# Patient Record
Sex: Male | Born: 1970 | Race: White | Hispanic: No | Marital: Married | Smoking: Former smoker
Health system: Southern US, Community
[De-identification: ages and names within clinical notes are randomized; demographics above are authoritative.]

## PROBLEM LIST (undated history)

## (undated) DIAGNOSIS — R011 Cardiac murmur, unspecified: Secondary | ICD-10-CM

## (undated) DIAGNOSIS — J301 Allergic rhinitis due to pollen: Secondary | ICD-10-CM

## (undated) DIAGNOSIS — J45909 Unspecified asthma, uncomplicated: Secondary | ICD-10-CM

## (undated) DIAGNOSIS — G609 Hereditary and idiopathic neuropathy, unspecified: Secondary | ICD-10-CM

## (undated) DIAGNOSIS — F419 Anxiety disorder, unspecified: Secondary | ICD-10-CM

## (undated) DIAGNOSIS — M19039 Primary osteoarthritis, unspecified wrist: Secondary | ICD-10-CM

## (undated) DIAGNOSIS — J683 Other acute and subacute respiratory conditions due to chemicals, gases, fumes and vapors: Secondary | ICD-10-CM

## (undated) HISTORY — DX: Unspecified asthma, uncomplicated: J45.909

## (undated) HISTORY — DX: Other acute and subacute respiratory conditions due to chemicals, gases, fumes and vapors: J68.3

## (undated) HISTORY — PX: COLONOSCOPY: SHX174

## (undated) HISTORY — DX: Primary osteoarthritis, unspecified wrist: M19.039

## (undated) HISTORY — DX: Anxiety disorder, unspecified: F41.9

## (undated) HISTORY — DX: Cardiac murmur, unspecified: R01.1

## (undated) HISTORY — DX: Allergic rhinitis due to pollen: J30.1

## (undated) HISTORY — PX: KNEE ARTHROSCOPY WITH ANTERIOR CRUCIATE LIGAMENT (ACL) REPAIR: SHX5644

---
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
## 2010-04-12 ENCOUNTER — Ambulatory Visit: Payer: Self-pay | Admitting: Internal Medicine

## 2010-08-25 ENCOUNTER — Ambulatory Visit: Payer: Self-pay | Admitting: Internal Medicine

## 2011-03-07 ENCOUNTER — Ambulatory Visit: Payer: Self-pay | Admitting: Family Medicine

## 2012-05-30 ENCOUNTER — Ambulatory Visit: Payer: Self-pay | Admitting: Family Medicine

## 2012-08-07 ENCOUNTER — Encounter: Payer: Self-pay | Admitting: Internal Medicine

## 2012-08-07 ENCOUNTER — Ambulatory Visit (INDEPENDENT_AMBULATORY_CARE_PROVIDER_SITE_OTHER): Payer: BC Managed Care – PPO | Admitting: Internal Medicine

## 2012-08-07 VITALS — BP 108/60 | HR 58 | Temp 97.9°F | Ht 71.0 in | Wt 218.0 lb

## 2012-08-07 DIAGNOSIS — M19039 Primary osteoarthritis, unspecified wrist: Secondary | ICD-10-CM | POA: Insufficient documentation

## 2012-08-07 DIAGNOSIS — R011 Cardiac murmur, unspecified: Secondary | ICD-10-CM

## 2012-08-07 DIAGNOSIS — Z23 Encounter for immunization: Secondary | ICD-10-CM

## 2012-08-07 DIAGNOSIS — Z0000 Encounter for general adult medical examination without abnormal findings: Secondary | ICD-10-CM

## 2012-08-07 DIAGNOSIS — J683 Other acute and subacute respiratory conditions due to chemicals, gases, fumes and vapors: Secondary | ICD-10-CM

## 2012-08-07 DIAGNOSIS — I340 Nonrheumatic mitral (valve) insufficiency: Secondary | ICD-10-CM | POA: Insufficient documentation

## 2012-08-07 DIAGNOSIS — J45909 Unspecified asthma, uncomplicated: Secondary | ICD-10-CM

## 2012-08-07 DIAGNOSIS — J301 Allergic rhinitis due to pollen: Secondary | ICD-10-CM

## 2012-08-07 MED ORDER — FLUTICASONE-SALMETEROL 250-50 MCG/DOSE IN AEPB: 1.0000 | INHALATION_SPRAY | Freq: Two times a day (BID) | RESPIRATORY_TRACT | Status: DC

## 2012-08-07 NOTE — Assessment & Plan Note (Signed)
??  MR Murmur not very apparent Will wait to get reports from Dr Gwen Pounds
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
## 2012-08-07 NOTE — Assessment & Plan Note (Signed)
Healthy Has been working on fitness Tdap updated

## 2012-08-07 NOTE — Assessment & Plan Note (Signed)
Has lots of mucus Discussed being more regular with loratadine

## 2012-08-07 NOTE — Assessment & Plan Note (Signed)
Mild intermittent symptoms Normal spirometry Does not use rescue inhaler No changes needed

## 2012-11-07 ENCOUNTER — Encounter: Payer: Self-pay | Admitting: Family Medicine

## 2012-11-07 ENCOUNTER — Ambulatory Visit (INDEPENDENT_AMBULATORY_CARE_PROVIDER_SITE_OTHER): Payer: BC Managed Care – PPO | Admitting: Family Medicine

## 2012-11-07 ENCOUNTER — Telehealth: Payer: Self-pay | Admitting: Internal Medicine

## 2012-11-07 VITALS — BP 110/72 | HR 63 | Temp 98.0°F | Wt 221.0 lb

## 2012-11-07 DIAGNOSIS — J329 Chronic sinusitis, unspecified: Secondary | ICD-10-CM | POA: Insufficient documentation

## 2012-11-07 MED ORDER — HYDROCODONE-HOMATROPINE 5-1.5 MG/5ML PO SYRP: 5.0000 mL | ORAL_SOLUTION | Freq: Every evening | ORAL | Status: DC | PRN

## 2012-11-07 MED ORDER — AZITHROMYCIN 250 MG PO TABS: ORAL_TABLET | ORAL | Status: DC

## 2012-11-07 NOTE — Telephone Encounter (Signed)
[facility] Triage Call Report Triage Record Num: [id number] Operator: Griselda Miner Patient Name: Paul Holland Call Date & Time: 11/06/2012 5:47:56PM Patient Phone: [phone] PCP: Tillman Abide Patient Gender: Male PCP Fax : [phone] Patient DOB: [birth year] Practice Name: [facility] Reason for Call: Caller: [name]; PCP: Tillman Abide (Family Practice); CB#: [phone]; Call regarding Cough/Congestion; Onset of symptoms with sore throat 11/03/12; Cough with drainage; Nasal drainage is clear; Cough non -productive; Is wheezing as noted by wife-coughing is fairly constant; is getting hot and cold. Appetite has not changed. Triaged using Cough with a disposition to be seen within 24 hours due to recurrent episodes of uncontrolled coughng interfering with ability to carry out usual activities or with normal sleep pattern. Care advice given. Appointment made for 11/07/12 at 10:15 with Dr. Sharen Hones. Caller demonstrated understanding. Protocol(s) Used: Cough - Adult Recommended Outcome per Protocol: See Provider within 24 hours Reason for Outcome: Recurrent episodes of uncontrolled coughing interfering with ability to carry out usual activities or with normal sleep patterns Care Advice: Call EMS 911 if sudden onset or sudden worsening of breathing problems, struggling to breathe, high pitched noise when breathing in (stridor), unable to speak, grasping at throat, or panic/anxiety because of breathing problems. ~ Increase fluids to 8-12 eight oz (1.6 to 2.4 liters) glasses per day, half of them to be water. Soups, popsicles, fruit juices, non-caffeinated sodas (unless restricting sodium intake), jello, broths, decaf teas, etc. are all okay. Warm fluids can be soothing. ~ Try to identify possible situations or irritants that triggered your symptoms (including medications) and be sure to tell your provider. ~ ~ Call provider immediately to report symptoms, if  you have not been previously diagnosed for this problem. 11/06/2012 6:09:07PM Page 1 of 1 [id number]

## 2012-11-07 NOTE — Progress Notes (Signed)
  Subjective:    Patient ID: Paul Holland, male    DOB: March 04, 1971, 41 y.o.   MRN: [id number]  HPI CC: cough  5d h/o ST, progressively worsening.  + sinus congestion, PNdrainage, fuzzy head.  Cough becoming more aggressive with phlegm production.  + chest discomfort with cough last night.  Tried dayquil and alka selzter day and night.  No fevers/chills, HA, ear or tooth pain, abd pain, n/v.  No sob, wheezing.  Asthma well controlled with advair.  Does not use albuterol. No sick contacts at home.  No smokers at home.  Past Medical History  Diagnosis Date  . Chemical-induced asthma     resin exposure at work  . Allergic rhinitis due to pollen   . Osteoarthritis of wrist   . Heart murmur     probably mild MR. Sees Dr Gwen Pounds     Review of Systems Per HPI    Objective:   Physical Exam  Nursing note and vitals reviewed. Constitutional: He appears well-developed and well-nourished. No distress.  HENT:  Head: Normocephalic and atraumatic.  Right Ear: Hearing, tympanic membrane, external ear and ear canal normal.  Left Ear: Hearing, tympanic membrane, external ear and ear canal normal.  Nose: Mucosal edema present. No rhinorrhea. Right sinus exhibits no maxillary sinus tenderness and no frontal sinus tenderness. Left sinus exhibits no maxillary sinus tenderness and no frontal sinus tenderness.  Mouth/Throat: Uvula is midline and mucous membranes are normal. Posterior oropharyngeal erythema present. No oropharyngeal exudate, posterior oropharyngeal edema or tonsillar abscesses.       TMs congested  Eyes: Conjunctivae normal and EOM are normal. Pupils are equal, round, and reactive to light. No scleral icterus.  Neck: Normal range of motion. Neck supple.  Cardiovascular: Normal rate, regular rhythm, normal heart sounds and intact distal pulses.   No murmur heard. Pulmonary/Chest: Effort normal and breath sounds normal. No respiratory distress. He has no wheezes. He has no rales.       clear  Lymphadenopathy:    He has no cervical adenopathy.  Skin: Skin is warm and dry. No rash noted.       Assessment & Plan:

## 2012-11-07 NOTE — Telephone Encounter (Signed)
Will await that evaluation in office

## 2012-11-07 NOTE — Assessment & Plan Note (Signed)
Anticipate viral given short duration of 5 days.  discussed this. Provided cough syrup and WASP for zpack to fill if sxs prolonged >10 days or worsening cough/fevers. Pt agrees with plan.

## 2012-11-07 NOTE — Patient Instructions (Signed)
Sounds like you have an upper respiratory infection, likely viral. Antibiotics are not needed for this.  Viral infections usually take 7-10 days to resolve.  The cough can last several weeks to go away. Continue medicines at home. Push fluids and plenty of rest. If cough worsening may fill hycodan cough syrup for night time. If fever >101, or worsening productive cough ,or symptoms past 10 days, fill antibiotic provided today. Call clinic with questions.  Good to see you today.

## 2013-05-14 ENCOUNTER — Ambulatory Visit: Payer: Self-pay | Admitting: Orthopedic Surgery

## 2013-05-19 ENCOUNTER — Ambulatory Visit: Payer: Self-pay | Admitting: Orthopedic Surgery

## 2013-05-20 ENCOUNTER — Ambulatory Visit: Payer: Self-pay | Admitting: Orthopedic Surgery

## 2013-07-24 ENCOUNTER — Ambulatory Visit: Payer: Self-pay | Admitting: Orthopedic Surgery

## 2013-08-07 ENCOUNTER — Encounter: Payer: BC Managed Care – PPO | Admitting: Internal Medicine
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
## 2013-09-02 ENCOUNTER — Other Ambulatory Visit: Payer: Self-pay | Admitting: Internal Medicine

## 2013-09-04 NOTE — Telephone Encounter (Signed)
Ok to fill 

## 2013-10-13 ENCOUNTER — Encounter: Payer: Self-pay | Admitting: Internal Medicine

## 2013-10-13 ENCOUNTER — Ambulatory Visit (INDEPENDENT_AMBULATORY_CARE_PROVIDER_SITE_OTHER): Payer: BC Managed Care – PPO | Admitting: Internal Medicine

## 2013-10-13 VITALS — BP 128/88 | HR 80 | Temp 99.2°F | Wt 237.0 lb

## 2013-10-13 DIAGNOSIS — J209 Acute bronchitis, unspecified: Secondary | ICD-10-CM | POA: Insufficient documentation

## 2013-10-13 MED ORDER — ALBUTEROL SULFATE HFA 108 (90 BASE) MCG/ACT IN AERS: 2.0000 | INHALATION_SPRAY | Freq: Four times a day (QID) | RESPIRATORY_TRACT | Status: DC | PRN

## 2013-10-13 MED ORDER — AMOXICILLIN 500 MG PO TABS: 1000.0000 mg | ORAL_TABLET | Freq: Two times a day (BID) | ORAL | Status: DC

## 2013-10-13 MED ORDER — HYDROCODONE-HOMATROPINE 5-1.5 MG/5ML PO SYRP: 5.0000 mL | ORAL_SOLUTION | Freq: Every evening | ORAL | Status: DC | PRN

## 2013-10-13 NOTE — Assessment & Plan Note (Signed)
Probably still viral With known lung damage--- may have some bronchospasm Will give cough syrup and albuterol   If worsens, can fill Rx for amoxil

## 2013-10-13 NOTE — Patient Instructions (Signed)
Please start the antibiotic if you get worse over by the end of the week (instead of improving)
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
## 2014-07-22 ENCOUNTER — Encounter: Payer: Self-pay | Admitting: Internal Medicine

## 2014-07-22 ENCOUNTER — Ambulatory Visit (INDEPENDENT_AMBULATORY_CARE_PROVIDER_SITE_OTHER): Payer: BC Managed Care – PPO | Admitting: Internal Medicine

## 2014-07-22 VITALS — BP 100/70 | HR 80 | Temp 98.2°F | Ht 71.5 in | Wt 240.0 lb

## 2014-07-22 DIAGNOSIS — I340 Nonrheumatic mitral (valve) insufficiency: Secondary | ICD-10-CM

## 2014-07-22 DIAGNOSIS — R5383 Other fatigue: Secondary | ICD-10-CM | POA: Insufficient documentation

## 2014-07-22 DIAGNOSIS — Z0000 Encounter for general adult medical examination without abnormal findings: Secondary | ICD-10-CM

## 2014-07-22 DIAGNOSIS — J683 Other acute and subacute respiratory conditions due to chemicals, gases, fumes and vapors: Secondary | ICD-10-CM

## 2014-07-22 DIAGNOSIS — R5381 Other malaise: Secondary | ICD-10-CM

## 2014-07-22 DIAGNOSIS — I059 Rheumatic mitral valve disease, unspecified: Secondary | ICD-10-CM

## 2014-07-22 LAB — COMPREHENSIVE METABOLIC PANEL
ALBUMIN: 4.3 g/dL (ref 3.5–5.2)
ALT: 50 U/L (ref 0–53)
AST: 34 U/L (ref 0–37)
Alkaline Phosphatase: 66 U/L (ref 39–117)
BUN: 13 mg/dL (ref 6–23)
CALCIUM: 9.4 mg/dL (ref 8.4–10.5)
CO2: 30 mEq/L (ref 19–32)
Chloride: 100 mEq/L (ref 96–112)
Creatinine, Ser: 1.1 mg/dL (ref 0.4–1.5)
GFR: 80.75 mL/min (ref >60.00)
Glucose, Bld: 97 mg/dL (ref 70–99)
POTASSIUM: 4.2 meq/L (ref 3.5–5.1)
Sodium: 137 mEq/L (ref 135–145)
TOTAL PROTEIN: 8 g/dL (ref 6.0–8.3)
Total Bilirubin: 1 mg/dL (ref 0.2–1.2)

## 2014-07-22 LAB — CBC WITH DIFFERENTIAL/PLATELET
BASOS ABS: 0 10*3/uL (ref 0.0–0.1)
Basophils Relative: 0.7 % (ref 0.0–3.0)
EOS ABS: 0.2 10*3/uL (ref 0.0–0.7)
Eosinophils Relative: 2.2 % (ref 0.0–5.0)
HCT: 47.9 % (ref 39.0–52.0)
Hemoglobin: 16.5 g/dL (ref 13.0–17.0)
LYMPHS PCT: 34.8 % (ref 12.0–46.0)
Lymphs Abs: 2.5 10*3/uL (ref 0.7–4.0)
MCHC: 34.5 g/dL (ref 30.0–36.0)
MCV: 94.4 fl (ref 78.0–100.0)
Monocytes Absolute: 0.7 10*3/uL (ref 0.1–1.0)
Monocytes Relative: 9.2 % (ref 3.0–12.0)
Neutro Abs: 3.8 10*3/uL (ref 1.4–7.7)
Neutrophils Relative %: 53.1 % (ref 43.0–77.0)
PLATELETS: 269 10*3/uL (ref 150.0–400.0)
RBC: 5.08 Mil/uL (ref 4.22–5.81)
RDW: 13.4 % (ref 11.5–15.5)
WBC: 7.1 10*3/uL (ref 4.0–10.5)

## 2014-07-22 LAB — LIPID PANEL
CHOL/HDL RATIO: 5
CHOLESTEROL: 190 mg/dL (ref 0–200)
HDL: 41.8 mg/dL (ref >39.00)
LDL Cholesterol: 120 mg/dL — ABNORMAL HIGH (ref 0–99)
NonHDL: 148.2
TRIGLYCERIDES: 140 mg/dL (ref 0.0–149.0)
VLDL: 28 mg/dL (ref 0.0–40.0)

## 2014-07-22 LAB — T4, FREE: Free T4: 0.75 ng/dL (ref 0.60–1.60)

## 2014-07-22 NOTE — Patient Instructions (Signed)
DASH Eating Plan DASH stands for "Dietary Approaches to Stop Hypertension." The DASH eating plan is a healthy eating plan that has been shown to reduce high blood pressure (hypertension). Additional health benefits may include reducing the risk of type 2 diabetes mellitus, heart disease, and stroke. The DASH eating plan may also help with weight loss. WHAT DO I NEED TO KNOW ABOUT THE DASH EATING PLAN? For the DASH eating plan, you will follow these general guidelines:  Choose foods with a percent daily value for sodium of less than 5% (as listed on the food label).  Use salt-free seasonings or herbs instead of table salt or sea salt.  Check with your health care provider or pharmacist before using salt substitutes.  Eat lower-sodium products, often labeled as "lower sodium" or "no salt added."  Eat fresh foods.  Eat more vegetables, fruits, and low-fat dairy products.  Choose whole grains. Look for the word "whole" as the first word in the ingredient list.  Choose fish and skinless chicken or turkey more often than red meat. Limit fish, poultry, and meat to 6 oz (170 g) each day.  Limit sweets, desserts, sugars, and sugary drinks.  Choose heart-healthy fats.  Limit cheese to 1 oz (28 g) per day.  Eat more home-cooked food and less restaurant, buffet, and fast food.  Limit fried foods.  Cook foods using methods other than frying.  Limit canned vegetables. If you do use them, rinse them well to decrease the sodium.  When eating at a restaurant, ask that your food be prepared with less salt, or no salt if possible. WHAT FOODS CAN I EAT? Seek help from a dietitian for individual calorie needs. Grains Whole grain or whole wheat bread. Brown rice. Whole grain or whole wheat pasta. Quinoa, bulgur, and whole grain cereals. Low-sodium cereals. Corn or whole wheat flour tortillas. Whole grain cornbread. Whole grain crackers. Low-sodium crackers. Vegetables Fresh or frozen vegetables  (raw, steamed, roasted, or grilled). Low-sodium or reduced-sodium tomato and vegetable juices. Low-sodium or reduced-sodium tomato sauce and paste. Low-sodium or reduced-sodium canned vegetables.  Fruits All fresh, canned (in natural juice), or frozen fruits. Meat and Other Protein Products Ground beef (85% or leaner), grass-fed beef, or beef trimmed of fat. Skinless chicken or turkey. Ground chicken or turkey. Pork trimmed of fat. All fish and seafood. Eggs. Dried beans, peas, or lentils. Unsalted nuts and seeds. Unsalted canned beans. Dairy Low-fat dairy products, such as skim or 1% milk, 2% or reduced-fat cheeses, low-fat ricotta or cottage cheese, or plain low-fat yogurt. Low-sodium or reduced-sodium cheeses. Fats and Oils Tub margarines without trans fats. Light or reduced-fat mayonnaise and salad dressings (reduced sodium). Avocado. Safflower, olive, or canola oils. Natural peanut or almond butter. Other Unsalted popcorn and pretzels. The items listed above may not be a complete list of recommended foods or beverages. Contact your dietitian for more options. WHAT FOODS ARE NOT RECOMMENDED? Grains White bread. White pasta. White rice. Refined cornbread. Bagels and croissants. Crackers that contain trans fat. Vegetables Creamed or fried vegetables. Vegetables in a cheese sauce. Regular canned vegetables. Regular canned tomato sauce and paste. Regular tomato and vegetable juices. Fruits Dried fruits. Canned fruit in light or heavy syrup. Fruit juice. Meat and Other Protein Products Fatty cuts of meat. Ribs, chicken wings, bacon, sausage, bologna, salami, chitterlings, fatback, hot dogs, bratwurst, and packaged luncheon meats. Salted nuts and seeds. Canned beans with salt. Dairy Whole or 2% milk, cream, half-and-half, and cream cheese. Whole-fat or sweetened yogurt. Full-fat   cheeses or blue cheese. Nondairy creamers and whipped toppings. Processed cheese, cheese spreads, or cheese  curds. Condiments Onion and garlic salt, seasoned salt, table salt, and sea salt. Canned and packaged gravies. Worcestershire sauce. Tartar sauce. Barbecue sauce. Teriyaki sauce. Soy sauce, including reduced sodium. Steak sauce. Fish sauce. Oyster sauce. Cocktail sauce. Horseradish. Ketchup and mustard. Meat flavorings and tenderizers. Bouillon cubes. Hot sauce. Tabasco sauce. Marinades. Taco seasonings. Relishes. Fats and Oils Butter, stick margarine, lard, shortening, ghee, and bacon fat. Coconut, palm kernel, or palm oils. Regular salad dressings. Other Pickles and olives. Salted popcorn and pretzels. The items listed above may not be a complete list of foods and beverages to avoid. Contact your dietitian for more information. WHERE CAN I FIND MORE INFORMATION? National Heart, Lung, and [employer] Institute: www.nhlbi.nih.gov/health/health-topics/topics/dash/ Document Released: 11/09/2011 Document Revised: 04/06/2014 Document Reviewed: 09/24/2013 ExitCare Patient Information 2015 [employer]. This information is not intended to replace advice given to you by your health care provider. Make sure you discuss any questions you have with your health care provider. Plantar Fasciitis Plantar fasciitis is a common condition that causes foot pain. It is soreness (inflammation) of the band of tough fibrous tissue on the bottom of the foot that runs from the heel bone (calcaneus) to the ball of the foot. The cause of this soreness may be from excessive standing, poor fitting shoes, running on hard surfaces, being overweight, having an abnormal walk, or overuse (this is common in runners) of the painful foot or feet. It is also common in aerobic exercise dancers and ballet dancers. SYMPTOMS  Most people with plantar fasciitis complain of:  Severe pain in the morning on the bottom of their foot especially when taking the first steps out of bed. This pain recedes after a few minutes of walking.  Severe pain  is experienced also during walking following a long period of inactivity.  Pain is worse when walking barefoot or up stairs DIAGNOSIS   Your caregiver will diagnose this condition by examining and feeling your foot.  Special tests such as X-rays of your foot, are usually not needed. PREVENTION   Consult a sports medicine professional before beginning a new exercise program.  Walking programs offer a good workout. With walking there is a lower chance of overuse injuries common to runners. There is less impact and less jarring of the joints.  Begin all new exercise programs slowly. If problems or pain develop, decrease the amount of time or distance until you are at a comfortable level.  Wear good shoes and replace them regularly.  Stretch your foot and the heel cords at the back of the ankle (Achilles tendon) both before and after exercise.  Run or exercise on even surfaces that are not hard. For example, asphalt is better than pavement.  Do not run barefoot on hard surfaces.  If using a treadmill, vary the incline.  Do not continue to workout if you have foot or joint problems. Seek professional help if they do not improve. HOME CARE INSTRUCTIONS   Avoid activities that cause you pain until you recover.  Use ice or cold packs on the problem or painful areas after working out.  Only take over-the-counter or prescription medicines for pain, discomfort, or fever as directed by your caregiver.  Soft shoe inserts or athletic shoes with air or gel sole cushions may be helpful.  If problems continue or become more severe, consult a sports medicine caregiver or your own health care provider. Cortisone is a potent   anti-inflammatory medication that may be injected into the painful area. You can discuss this treatment with your caregiver. MAKE SURE YOU:   Understand these instructions.  Will watch your condition.  Will get help right away if you are not doing well or get  worse. Document Released: 08/15/2001 Document Revised: 02/12/2012 Document Reviewed: 10/14/2008 ExitCare Patient Information 2015 [employer]. This information is not intended to replace advice given to you by your health care provider. Make sure you discuss any questions you have with your health care provider.  

## 2014-07-22 NOTE — Assessment & Plan Note (Signed)
Vague  PE reassuring--no nodes, etc Will just check labs

## 2014-07-22 NOTE — Progress Notes (Signed)
Pre visit review using our clinic review tool, if applicable. No additional management support is needed unless otherwise documented below in the visit note. 

## 2014-07-22 NOTE — Progress Notes (Signed)
Subjective:    Patient ID: Paul Holland, male    DOB: 05-17-1971, 43 y.o.   MRN: [id number]  HPI Here for physical  Still struggles with his right leg Some swelling in foot at times Finished his rehab but not doing more work on [employer] some right heel pain after walking---after a rest. Discussed plantar fasciitis  Feels tired in the past few months ?due to not exercising as much Has gained weight Sleeps okay---works 3rd shift and sleep is variable.  Current Outpatient Prescriptions on File Prior to Visit  Medication Sig Dispense Refill  . ADVAIR DISKUS 250-50 MCG/DOSE AEPB INHALE ONE DOSE BY MOUTH TWICE DAILY  60 each  3   No current facility-administered medications on file prior to visit.    No Known Allergies  Past Medical History  Diagnosis Date  . Chemical-induced asthma     resin exposure at work  . Allergic rhinitis due to pollen   . Osteoarthritis of wrist   . Heart murmur     probably mild MR. Sees Dr Nehemiah Massed    Past Surgical History  Procedure Laterality Date  . Cartilage removed from ribs  1981  . Knee arthroscopy with anterior cruciate ligament (acl) repair  6/14 & 8/14    meniscus repair and tibial fracture repair (bone graft). ACL not repaired    Family History  Problem Relation Age of Onset  . Diabetes Brother   . Hyperlipidemia Brother   . Hypertension Brother   . Cancer Paternal Uncle     colon  . Cancer Paternal Grandfather   . Heart disease Neg Hx     History   Social History  . Marital Status: Married    Spouse Name: N/A    Number of Children: 3  . Years of Education: N/A   Occupational History  . Supervisor-- Engineering geologist   Social History Main Topics  . Smoking status: Never Smoker   . Smokeless tobacco: Never Used  . Alcohol Use: Yes  . Drug Use: No  . Sexual Activity: Not on file   Other Topics Concern  . Not on file   Social History Narrative   2nd marriage   3 children who live with  mother in [place]----regular visits to them         Review of Systems  Constitutional: Positive for fatigue. Negative for unexpected weight change.       Wears seat belt  HENT: Negative for dental problem, hearing loss and tinnitus.        Overdue for dentist  Eyes: Negative for visual disturbance.       No diplopia or unilateral vision loss  Respiratory: Positive for cough. Negative for chest tightness and shortness of breath.        Only occasional cough  Cardiovascular: Positive for palpitations and leg swelling. Negative for chest pain.       Rare flutter in chest--- usually at rest.   Gastrointestinal: Negative for nausea, vomiting, abdominal pain, constipation and blood in stool.       No heartburn  Endocrine: Negative for cold intolerance and heat intolerance.  Genitourinary: Positive for difficulty urinating. Negative for urgency and frequency.       Occasional mild dribbling No sexual problems  Musculoskeletal: Positive for arthralgias, joint swelling and neck pain. Negative for back pain.       Only right knee problems Left wrist arthritis --but mild  Skin: Negative for rash.  Allergic/Immunologic: Positive  for environmental allergies. Negative for immunocompromised state.       Loratadine works  Neurological: Positive for numbness and headaches. Negative for dizziness, syncope, weakness and light-headedness.       Rare headache Arms go numb at times---better after chiropractic adjustment. CTS diagnosed years ago--no Rx  Hematological: Negative for adenopathy. Does not bruise/bleed easily.  Psychiatric/Behavioral: Negative for sleep disturbance and dysphoric mood. The patient is not nervous/anxious.        Objective:   Physical Exam  Constitutional: He is oriented to person, place, and time. He appears well-developed and well-nourished. No distress.  HENT:  Head: Normocephalic and atraumatic.  Right Ear: External ear normal.  Left Ear: External ear normal.    Mouth/Throat: Oropharynx is clear and moist. No oropharyngeal exudate.  Eyes: Conjunctivae and EOM are normal. Pupils are equal, round, and reactive to light.  Neck: Normal range of motion. Neck supple. No thyromegaly present.  Cardiovascular: Normal rate, regular rhythm and intact distal pulses.  Exam reveals no gallop.   No sig murmur heard  Abdominal: Soft. He exhibits no distension. There is no tenderness. There is no rebound and no guarding.  Musculoskeletal: He exhibits no edema and no tenderness.  Lymphadenopathy:    He has no cervical adenopathy.  Neurological: He is alert and oriented to person, place, and time.  Skin: No rash noted. No erythema.  Multiple benign nevi  Psychiatric: He has a normal mood and affect. His behavior is normal.          Assessment & Plan:

## 2014-07-22 NOTE — Assessment & Plan Note (Signed)
Healthy but out of shape Discussed fitness and rehab ongoing for his knee

## 2014-07-22 NOTE — Assessment & Plan Note (Signed)
Quiet on the advair

## 2014-07-22 NOTE — Assessment & Plan Note (Signed)
Very slight No action needed

## 2014-09-28 ENCOUNTER — Other Ambulatory Visit: Payer: Self-pay | Admitting: Family Medicine

## 2014-12-09 ENCOUNTER — Ambulatory Visit (INDEPENDENT_AMBULATORY_CARE_PROVIDER_SITE_OTHER): Payer: BLUE CROSS/BLUE SHIELD | Admitting: Internal Medicine

## 2014-12-09 ENCOUNTER — Encounter: Payer: Self-pay | Admitting: Internal Medicine

## 2014-12-09 VITALS — BP 124/72 | HR 70 | Temp 98.2°F | Wt 247.8 lb

## 2014-12-09 DIAGNOSIS — S61210D Laceration without foreign body of right index finger without damage to nail, subsequent encounter: Secondary | ICD-10-CM

## 2014-12-09 DIAGNOSIS — S61210A Laceration without foreign body of right index finger without damage to nail, initial encounter: Secondary | ICD-10-CM | POA: Insufficient documentation

## 2014-12-09 DIAGNOSIS — J069 Acute upper respiratory infection, unspecified: Secondary | ICD-10-CM | POA: Insufficient documentation

## 2014-12-09 NOTE — Progress Notes (Signed)
   Subjective:    Patient ID: Paul Holland, male    DOB: May 04, 1971, 44 y.o.   MRN: [id number]  HPI Was in [place] visiting family Flood in basement Using knife to cut away cords---slipped and cut right 2nd finger  Occurred 10 days ago No antibiotic sutured  Keeping wrapped Not inflamed or painful Slight itching  Also with chest cold Some cough Thinks it is improving--less drainage No SOB  Current Outpatient Prescriptions on File Prior to Visit  Medication Sig Dispense Refill  . ADVAIR DISKUS 250-50 MCG/DOSE AEPB INHALE ONE DOSE BY MOUTH TWICE DAILY 60 each 0  . loratadine (CLARITIN) 10 MG tablet Take 10-20 mg by mouth daily.     No current facility-administered medications on file prior to visit.    No Known Allergies  Past Medical History  Diagnosis Date  . Chemical-induced asthma     resin exposure at work  . Allergic rhinitis due to pollen   . Osteoarthritis of wrist   . Heart murmur     probably mild MR. Sees Dr Nehemiah Massed    Past Surgical History  Procedure Laterality Date  . Cartilage removed from ribs  1981  . Knee arthroscopy with anterior cruciate ligament (acl) repair  6/14 & 8/14    meniscus repair and tibial fracture repair (bone graft). ACL not repaired    Family History  Problem Relation Age of Onset  . Diabetes Brother   . Hyperlipidemia Brother   . Hypertension Brother   . Cancer Paternal Uncle     colon  . Cancer Paternal Grandfather   . Heart disease Neg Hx     History   Social History  . Marital Status: Married    Spouse Name: N/A    Number of Children: 3  . Years of Education: N/A   Occupational History  . Supervisor-- Engineering geologist   Social History Main Topics  . Smoking status: Never Smoker   . Smokeless tobacco: Never Used  . Alcohol Use: Yes  . Drug Use: No  . Sexual Activity: Not on file   Other Topics Concern  . Not on file   Social History Narrative   2nd marriage   3 children who live with  mother in [place]----regular visits to them         Review of Systems  No fever Hasn't been sick     Objective:   Physical Exam  Constitutional: He appears well-developed and well-nourished. No distress.  HENT:  Mouth/Throat: Oropharynx is clear and moist. No oropharyngeal exudate.  Neck: Normal range of motion. Neck supple.  Pulmonary/Chest: Effort normal and breath sounds normal. No respiratory distress. He has no wheezes. He has no rales.  Lymphadenopathy:    He has no cervical adenopathy.  Skin:  Transverse laceration across extensor right 2nd MCP 4 stitches removed Still open wound--- steri strips applied with benzoin          Assessment & Plan:

## 2014-12-09 NOTE — Progress Notes (Signed)
Pre visit review using our clinic review tool, if applicable. No additional management support is needed unless otherwise documented below in the visit note. 

## 2014-12-09 NOTE — Assessment & Plan Note (Signed)
Clean but not completely healed Sutures removed steristrips applied

## 2014-12-09 NOTE — Assessment & Plan Note (Signed)
Reassured Does seem to be self limited process Supportive Rx only

## 2015-03-26 NOTE — Op Note (Signed)
PATIENT NAME:  Paul Holland, BRAHMBHATT MR#:  [id number] DATE OF BIRTH:  1970-12-16  DATE OF PROCEDURE:  05/20/2013  PREOPERATIVE DIAGNOSIS:  Right lateral tibial plateau fracture.   POSTOPERATIVE DIAGNOSIS:  Right lateral tibial plateau fracture with lateral meniscus tear.   PROCEDURE:  Open reduction internal fixation lateral tibial plateau, lateral meniscus repair.   ANESTHESIA:  Spinal.  SURGEON:  Laurene Footman, M.D.  DESCRIPTION OF PROCEDURE:  The patient was brought to the operating room and after adequate anesthesia was obtained the C-arm was brought in to make sure there was good visualization on both AP and lateral projections.  After prepping and draping in the usual sterile fashion with a tourniquet applied to the upper thigh, the tourniquet was raised to 300 mmHg after appropriate patient identification, timeout procedures were completed.  A curvilinear incision was made over the proximal tibia laterally and the IT band split, insertion elevated at the tubercle.  The fracture site was identified and a drill holes were made to allow for removal of a bone window to allow for bone grafting impaction of the displaced joint fragments.  C-arm was brought in for this and with a narrow bone tamp the fracture fragments were elevated.  After elevating them the arthroscope was introduced through an inferolateral portal with a probe introduced inferomedially.  Inspection revealed mild patellofemoral chondromalacia.  In the medial compartment there was significant degenerative change in the medial femoral condyle that appeared chronic with intact meniscus.  The ACL appeared intact although there was some hemorrhage in the synovium adjacent to it.  On probing it appeared intact.  Going laterally, the joint fractures were identified and the lateral meniscus was found to have detached off the lateral capsule and was interposed into the fracture site.  At this point, the arthroscope was withdrawn and a submeniscal  arthrotomy carried out.  The meniscus was then grasped with sutures for subsequent repair to the capsule through the plate.  Bone graft was then impacted up through the bone window with cancellus bone graft inserted.  A clamp was then used to reduce the fracture with a plate applied.  A 5 hole Biomet lateral tibial Alps plate, standard curvature, and a K wire were placed through this to hold it in position.  After getting appropriate plate position the sutures were placed through the plate and the distal screw holes were filled through stab incisions with three cortical screws placed.  Next, the proximal screws were filled first with a nonlocking screw through the more proximal holes to get compression of the fracture site.  The arthroscope was put back in the joint at this time.  There was a little joint displacement at this point.  Next, the remaining proximal screws were filled using standard technique, drilling, measuring and placing the locking screws and then the 2 kickstand screws were inserted.  AP and lateral imaging at this point showed good position of the plate on both projections.  The meniscal sutures were then tightened and the meniscus appeared stable through the scope.  The arthrotomy was then repaired using Ethibond suture, the IT band repaired using #1 Vicryl, 2-0 Vicryl subcutaneously and skin staples for all incisions.  Xeroform, 4 x 4's, Webril, and Ace wrap were applied along with a Polar Care.  Tourniquet was let down prior to closure to allow for hemostasis.    TOURNIQUET TIME:  97 minutes at 300 mmHg.   ESTIMATED BLOOD LOSS:  100 mL.   COMPLICATIONS:  None.   SPECIMEN:  None.   IMPLANT:  Biomet Alps plate 5 hole on the right lateral tibial plateau plate with multiple screws.     ____________________________ Laurene Footman, MD mjm:ea D: 05/20/2013 22:26:14 ET T: 05/20/2013 23:02:24 ET JOB#: [id number]  cc: Laurene Footman, MD, <Dictator> Laurene Footman MD ELECTRONICALLY  SIGNED 05/21/2013 8:25

## 2015-03-26 NOTE — Op Note (Signed)
PATIENT NAME:  Paul Holland, Paul Holland MR#:  [id number] DATE OF BIRTH:  Jun 09, 1971  DATE OF PROCEDURE:  07/24/2013  PREOPERATIVE DIAGNOSIS: Right knee lateral meniscus tear, arthrofibrosis.   POSTOPERATIVE DIAGNOSIS: Synovitis right knee with anterior cruciate ligament tear.   PROCEDURE: Arthroscopy and synovectomy, right knee.   SURGEON: Laurene Footman, M.D.   ANESTHESIA: General.   DESCRIPTION OF PROCEDURE: The patient was brought to the operating room. After adequate anesthesia was obtained, the right knee was examined and with gentle manipulation additional flexion was obtained with palpable audible popping of adhesions. The leg was then placed in the arthroscopic legholder with the tourniquet applied, and the leg was prepped and draped in the usual sterile fashion. Appropriate patient identification and timeout procedures completed. An inferolateral portal made followed by an inferomedial portal for instrumentation. 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 The medial compartment had mild chondromalacia, synovitis anteriorly. 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 The lateral compartment was examined. The meniscus had previously been detached off the capsule and on probing it did appear that it had healed. There was some irregularity. There was some loose fissured cartilage on the tibia, but no loose bodies were noted. The gutters were also checked for loose bodies. With it apparently being mainly synovitis being the problem, a shaver was used, with the tourniquet raised to 300 mmHg, to debride the synovitis and scar tissue within the gutters to open up the spaces to allow for motion as well as debriding some of this anterior synovitis in the medial and lateral  compartments as well as in front of the notch.  It appeared to be minimizing the patella mobility. After extensive debridement of the this synovium, in the suprapatellar pouch, gutters, medial and lateral compartments, ArthroCare wand was used to aid in hemostasis. The knee was thoroughly irrigated and pre and postprocedure pictures having been obtained. All instrumentation was withdrawn. Tourniquet was let down prior to wound closure. Wounds were closed with simple interrupted 4-0 nylon skin sutures. 20 mL of 0.5% Sensorcaine with epinephrine was infiltrated into the area of the portals to aid in postoperative analgesia. Sterile dressings of Xeroform, 4 x 4's, Webril and Ace wrap were applied. The patient was sent to the recovery room in stable condition.   ESTIMATED BLOOD LOSS: Minimal.   COMPLICATIONS: None.   SPECIMEN: None.   CONDITION: To recovery room stable. ____________________________ Laurene Footman, MD mjm:sb D: 07/24/2013 13:37:07 ET T: 07/24/2013 17:21:56 ET JOB#: [id number]  cc: Laurene Footman, MD, <Dictator> Laurene Footman MD ELECTRONICALLY SIGNED 07/24/2013 17:58

## 2015-03-30 ENCOUNTER — Ambulatory Visit
Admit: 2015-03-30 | Disposition: A | Discharge status: Home / Self Care | Payer: Self-pay | Attending: Orthopedic Surgery | Admitting: Orthopedic Surgery

## 2015-04-04 NOTE — Op Note (Signed)
PATIENT NAME:  [name], Paul Holland MR#:  [id number] DATE OF BIRTH:  1971-02-14  DATE OF PROCEDURE:  03/30/2015  PREOPERATIVE DIAGNOSIS: Painful hardware, right lateral proximal tibia.   POSTOPERATIVE DIAGNOSIS:   Painful hardware, right lateral proximal tibia.   PROCEDURE: Removal of deep hardware.   ANESTHESIA: General.   SURGEON: Hessie Knows, MD   DESCRIPTION OF PROCEDURE: The patient was brought to the operating room and after adequate anesthesia was obtained, the right leg was prepped and draped in the usual sterile fashion. After patient identification and timeout procedures were completed, tourniquet was raised and the prior incision was opened and the plate exposed proximally. The kickstand screws were removed followed by the proximal screws then the distal screws corticals with the aid of a mini C-arm. All the screws could be removed except one from the proximal row of the plate.  This screw appeared stripped and could not be removed even using the broken screw set.  With the plate being quite painful, this screw needed to be removed, so  decision was made to extend the initial incision down anteriorly and elevating the anterior tibialis off the tibia. This exposed the plate and allowed me to essentially rotate the plate counterclockwise.  This  loosened the screw enough that it could be grasped with the wrench from the broken  screw set and the screws removed without difficulty.  The wound  was thoroughly irrigated. Tourniquet was let down and there was moderate bleeding from the screw holes in the tibia.   The tourniquet was raised again for closure with #1 Vicryl to repair anterior tibialis and the proximal split of the IT band, 2-0 Vicryl subcutaneously, and skin staples. Thirty mL of 0.5% Sensorcaine plain was infiltrated around the incision to aid in postoperative analgesia.  The wound was dressed with Xeroform, 4 x 4's, ABD, Webril and ACE wrap.    ESTIMATED BLOOD LOSS: Minimal.    COMPLICATIONS: None.   SPECIMEN: None. Hardware had a picture taken and then it was discarded.   TOURNIQUET TIME:  Was 56 minutes at 300 mmHg.     ____________________________ Laurene Footman, MD mjm:tr D: 03/30/2015 23:06:25 ET T: 03/31/2015 10:00:21 ET JOB#: [id number]  cc: Laurene Footman, MD, <Dictator> Laurene Footman MD ELECTRONICALLY SIGNED 03/31/2015 19:41

## 2015-07-29 ENCOUNTER — Ambulatory Visit (INDEPENDENT_AMBULATORY_CARE_PROVIDER_SITE_OTHER): Payer: BLUE CROSS/BLUE SHIELD | Admitting: Internal Medicine

## 2015-07-29 ENCOUNTER — Encounter: Payer: Self-pay | Admitting: Internal Medicine

## 2015-07-29 VITALS — BP 120/70 | HR 70 | Temp 98.4°F | Wt 241.0 lb

## 2015-07-29 DIAGNOSIS — M7711 Lateral epicondylitis, right elbow: Secondary | ICD-10-CM | POA: Diagnosis not present

## 2015-07-29 DIAGNOSIS — M7712 Lateral epicondylitis, left elbow: Secondary | ICD-10-CM | POA: Diagnosis not present

## 2015-07-29 NOTE — Progress Notes (Signed)
Pre visit review using our clinic review tool, if applicable. No additional management support is needed unless otherwise documented below in the visit note. 

## 2015-07-29 NOTE — Assessment & Plan Note (Signed)
Very mild No signs of bony issues Discussed ice and NSAIDs (and rest)

## 2015-07-29 NOTE — Progress Notes (Signed)
   Subjective:    Patient ID: Paul Holland, male    DOB: 03/17/1971, 44 y.o.   MRN: [id number]  HPI Here due to left elbow pain  Shadow boxing about a month ago Started feeling pain so took some time off Points to lateral epicondyle Some pain in same area in right elbow also Better now--but hasn't gone away  Found lump in neck when at beach ~2 weeks ago No pain Hasn't bothered him/no drainage  Ibuprofen 200 bid has helped  Current Outpatient Prescriptions on File Prior to Visit  Medication Sig Dispense Refill  . ADVAIR DISKUS 250-50 MCG/DOSE AEPB INHALE ONE DOSE BY MOUTH TWICE DAILY 60 each 0  . loratadine (CLARITIN) 10 MG tablet Take 10-20 mg by mouth daily.     No current facility-administered medications on file prior to visit.    No Known Allergies  Past Medical History  Diagnosis Date  . Chemical-induced asthma     resin exposure at work  . Allergic rhinitis due to pollen   . Osteoarthritis of wrist   . Heart murmur     probably mild MR. Sees Dr Nehemiah Massed    Past Surgical History  Procedure Laterality Date  . Cartilage removed from ribs  1981  . Knee arthroscopy with anterior cruciate ligament (acl) repair  6/14 & 8/14    meniscus repair and tibial fracture repair (bone graft). ACL not repaired    Family History  Problem Relation Age of Onset  . Diabetes Brother   . Hyperlipidemia Brother   . Hypertension Brother   . Cancer Paternal Uncle     colon  . Cancer Paternal Grandfather   . Heart disease Neg Hx     Social History   Social History  . Marital Status: Married    Spouse Name: N/A  . Number of Children: 3  . Years of Education: N/A   Occupational History  . Supervisor-- Engineering geologist   Social History Main Topics  . Smoking status: Never Smoker   . Smokeless tobacco: Never Used  . Alcohol Use: Yes  . Drug Use: No  . Sexual Activity: Not on file   Other Topics Concern  . Not on file   Social History Narrative   2nd marriage   3 children who live with mother in [place]----regular visits to them         Review of Systems Mostly walks around or on computer at work Does awaken with elbow pain at times--will shoot to arm at times Gets adjustments at chiropractor every 2-3 months    Objective:   Physical Exam  Constitutional: He appears well-developed and well-nourished. No distress.  Neck:  ~0.5 cm non tender subq mass in right lateral neck---node vs cyst. reassured  Musculoskeletal:  No swelling in either elbow No tenderness  Neurological:  No arm weakness          Assessment & Plan:

## 2015-12-27 ENCOUNTER — Ambulatory Visit (INDEPENDENT_AMBULATORY_CARE_PROVIDER_SITE_OTHER): Payer: BLUE CROSS/BLUE SHIELD | Admitting: Internal Medicine

## 2015-12-27 ENCOUNTER — Encounter: Payer: Self-pay | Admitting: Internal Medicine

## 2015-12-27 VITALS — BP 118/70 | HR 88 | Temp 98.4°F | Wt 241.0 lb

## 2015-12-27 DIAGNOSIS — L03032 Cellulitis of left toe: Secondary | ICD-10-CM

## 2015-12-27 DIAGNOSIS — S76219A Strain of adductor muscle, fascia and tendon of unspecified thigh, initial encounter: Secondary | ICD-10-CM | POA: Insufficient documentation

## 2015-12-27 DIAGNOSIS — S39011A Strain of muscle, fascia and tendon of abdomen, initial encounter: Secondary | ICD-10-CM | POA: Diagnosis not present

## 2015-12-27 MED ORDER — CEPHALEXIN 500 MG PO CAPS: 500.0000 mg | ORAL_CAPSULE | Freq: Three times a day (TID) | ORAL | Status: DC

## 2015-12-27 NOTE — Assessment & Plan Note (Signed)
On left Mild May be from the core program he is doing--moving back and forth on slide board

## 2015-12-27 NOTE — Progress Notes (Signed)
Pre visit review using our clinic review tool, if applicable. No additional management support is needed unless otherwise documented below in the visit note. 

## 2015-12-27 NOTE — Progress Notes (Signed)
   Subjective:    Patient ID: Paul Holland, male    DOB: [birth year], 45 y.o.   MRN: [id number]  HPI Here due to left great toe pain and pain in groin  Left great toe sore and red for a couple of weeks Thinks he has ingrown toenail Soaking has helped---epsom salts But then flares up Now constantly red No prior issues with ingrown nails--he keeps them trimmed No fever  Left testicular pain in past--had ultrasound Now with pain in curve around scrotum and leg Feels intermittent pressure and pain Can be when sitting, walking or even lying down  Current Outpatient Prescriptions on File Prior to Visit  Medication Sig Dispense Refill  . ADVAIR DISKUS 250-50 MCG/DOSE AEPB INHALE ONE DOSE BY MOUTH TWICE DAILY 60 each 0  . loratadine (CLARITIN) 10 MG tablet Take 10-20 mg by mouth daily.     No current facility-administered medications on file prior to visit.    No Known Allergies  Past Medical History  Diagnosis Date  . Chemical-induced asthma ([place])     resin exposure at work  . Allergic rhinitis due to pollen   . Osteoarthritis of wrist   . Heart murmur     probably mild MR. Sees Dr Nehemiah Massed    Past Surgical History  Procedure Laterality Date  . Cartilage removed from ribs  1981  . Knee arthroscopy with anterior cruciate ligament (acl) repair  6/14 & 8/14    meniscus repair and tibial fracture repair (bone graft). ACL not repaired    Family History  Problem Relation Age of Onset  . Diabetes Brother   . Hyperlipidemia Brother   . Hypertension Brother   . Cancer Paternal Uncle     colon  . Cancer Paternal Grandfather   . Heart disease Neg Hx     Social History   Social History  . Marital Status: Married    Spouse Name: N/A  . Number of Children: 3  . Years of Education: N/A   Occupational History  . Supervisor-- Engineering geologist   Social History Main Topics  . Smoking status: Never Smoker   . Smokeless tobacco: Never Used  . Alcohol Use:  Yes  . Drug Use: No  . Sexual Activity: Not on file   Other Topics Concern  . Not on file   Social History Narrative   2nd marriage   3 children who live with mother in [place]----regular visits to them         Review of Systems No urinary symptoms-- no dysuria or hematuria No intercourse in a month-- wife in [place]  (university professor) Bowels are fine Appetite is good    Objective:   Physical Exam  Genitourinary:  Scrotum is quiet---no hernia, testicular enlargement or tenderness No inguinal hernias or apparent femoral hernia. Pain area is right in base of groin  Skin:  ?slight distal lateral ingrowing at left great toenail Mild redness and tenderness No discharge          Assessment & Plan:

## 2015-12-27 NOTE — Assessment & Plan Note (Signed)
Mild Discussed continued soaks and distal nail debridement (explained how to do this) 5 days cephalexin

## 2016-02-07 ENCOUNTER — Encounter: Payer: Self-pay | Admitting: Primary Care

## 2016-02-07 ENCOUNTER — Ambulatory Visit: Payer: BLUE CROSS/BLUE SHIELD | Admitting: Internal Medicine

## 2016-02-07 ENCOUNTER — Ambulatory Visit (INDEPENDENT_AMBULATORY_CARE_PROVIDER_SITE_OTHER): Payer: BLUE CROSS/BLUE SHIELD | Admitting: Primary Care

## 2016-02-07 VITALS — BP 136/84 | HR 96 | Temp 99.2°F | Ht 71.5 in | Wt 246.4 lb

## 2016-02-07 DIAGNOSIS — R059 Cough, unspecified: Secondary | ICD-10-CM

## 2016-02-07 DIAGNOSIS — R05 Cough: Secondary | ICD-10-CM | POA: Diagnosis not present

## 2016-02-07 MED ORDER — BENZONATATE 200 MG PO CAPS: 200.0000 mg | ORAL_CAPSULE | Freq: Three times a day (TID) | ORAL | Status: DC | PRN

## 2016-02-07 MED ORDER — HYDROCODONE-HOMATROPINE 5-1.5 MG/5ML PO SYRP: 5.0000 mL | ORAL_SOLUTION | Freq: Every evening | ORAL | Status: DC | PRN

## 2016-02-07 MED ORDER — FLUTICASONE-SALMETEROL 250-50 MCG/DOSE IN AEPB: 1.0000 | INHALATION_SPRAY | Freq: Two times a day (BID) | RESPIRATORY_TRACT | Status: DC

## 2016-02-07 NOTE — Progress Notes (Signed)
Pre visit review using our clinic review tool, if applicable. No additional management support is needed unless otherwise documented below in the visit note. 

## 2016-02-07 NOTE — Progress Notes (Signed)
Subjective:    Patient ID: Paul Holland, male    DOB: 1971-07-15, 45 y.o.   MRN: [id number]  HPI  Mr. Trombly is a 45 year old male who presents today with a chief complaint of cough. He also reports chest congestion, nasal congestion, and sore throat. His symptoms began Saturday this past weekend (2 days ago). He has a low grade fever today in the clinic. He's has co-workers who were diagnosed with both the flu and strep last week. He's taken Mucinex and cough drops with temporary improvement. Nothing in particular makes his symptoms worse. Denies home sick contacts.  Review of Systems  Constitutional: Positive for fever, chills and fatigue.  HENT: Positive for congestion and sore throat. Negative for ear pain and sinus pressure.   Respiratory: Positive for cough. Negative for shortness of breath.   Cardiovascular: Negative for chest pain.  Musculoskeletal: Negative for myalgias.       Past Medical History  Diagnosis Date  . 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 Allergic rhinitis due to pollen   . Osteoarthritis of wrist   . Heart murmur     probably mild MR. Sees Dr Nehemiah Massed    Social History   Social History  . Marital Status: Married    Spouse Name: N/A  . Number of Children: 3  . Years of Education: N/A   Occupational History  . Supervisor-- Engineering geologist   Social History Main Topics  . Smoking status: Never Smoker   . Smokeless tobacco: Never Used  . Alcohol Use: Yes  . Drug Use: No  . Sexual Activity: Not on file   Other Topics Concern  . Not on file   Social History Narrative   2nd marriage   3 children who live with mother in [place]----regular visits to them          Past Surgical History  Procedure Laterality Date  . Cartilage removed from ribs  1981  . Knee arthroscopy with anterior cruciate ligament (acl) repair  6/14 & 8/14    meniscus repair and tibial fracture repair (bone graft). ACL not repaired      Family History  Problem Relation Age of Onset  . Diabetes Brother   . Hyperlipidemia Brother   . Hypertension Brother   . Cancer Paternal Uncle     colon  . Cancer Paternal Grandfather   . Heart disease Neg Hx     No Known Allergies  Current Outpatient Prescriptions on File Prior to Visit  Medication Sig Dispense Refill  . loratadine (CLARITIN) 10 MG tablet Take 10-20 mg by mouth daily.     No current facility-administered medications on file prior to visit.    BP 136/84 mmHg  Pulse 96  Temp(Src) 99.2 F (37.3 C) (Oral)  Ht 5' 11.5" (1.816 m)  Wt 246 lb 6.4 oz (111.766 kg)  BMI 33.89 kg/m2  SpO2 97%    Objective:   Physical Exam  Constitutional: He appears well-nourished.  HENT:  Right Ear: Tympanic membrane and ear canal normal.  Left Ear: Tympanic membrane and ear canal normal.  Nose: Right sinus exhibits no maxillary sinus tenderness and no frontal sinus tenderness. Left sinus exhibits no maxillary sinus tenderness and no frontal sinus tenderness.  Mouth/Throat: Oropharynx is clear and moist.  Neck: Neck supple.  Cardiovascular: Normal rate and regular rhythm.   Pulmonary/Chest: Effort normal and breath sounds normal. He has no wheezes.  Lymphadenopathy:  He has no cervical adenopathy.  Skin: Skin is warm and dry.          Assessment & Plan:  Viral URI:  Cough, congestion, low grade fever, fatigue x 2 days. Co-workers with flu and strep.  Exam with clear lungs and unremarkable HENT exam. Doesn't appear sickly, but tired. Suspect viral URI and will treat with supportive measures. Mucinex, Flonase, tessalon pearls for day cough, Hycodan HS. Return precautions provided.

## 2016-02-07 NOTE — Patient Instructions (Signed)
Cough/Congestion: Continue taking Mucinex DM. This will help loosen up the mucous in your chest. Ensure you take this medication with a full glass of water.  Nasal Congestion: Try using Flonase (fluticasone) nasal spray. Instill 2 sprays in each nostril once daily.   You may take Benzonatate capsules for cough. Take 1 capsule by mouth three times daily as needed for daytime cough. You may also try Delsym, which is over the counter.  You may take the Hycodan cough suppressant at bedtime as needed for cough and rest. Caution this medication contains codeine and will make you feel drowsy.  Please notify me if you develop persistent fevers of 101, start coughing up green mucous, notice increased fatigue or weakness, or feel worse after 1 week of onset of symptoms.   Increase consumption of water intake and rest.  It was a pleasure meeting you!

## 2016-08-03 ENCOUNTER — Ambulatory Visit (INDEPENDENT_AMBULATORY_CARE_PROVIDER_SITE_OTHER): Payer: BLUE CROSS/BLUE SHIELD | Admitting: Internal Medicine

## 2016-08-03 ENCOUNTER — Encounter: Payer: Self-pay | Admitting: Internal Medicine

## 2016-08-03 VITALS — BP 102/80 | HR 77 | Temp 98.1°F | Ht 70.75 in | Wt 244.0 lb

## 2016-08-03 DIAGNOSIS — Z0000 Encounter for general adult medical examination without abnormal findings: Secondary | ICD-10-CM | POA: Diagnosis not present

## 2016-08-03 DIAGNOSIS — J45998 Other asthma: Secondary | ICD-10-CM

## 2016-08-03 DIAGNOSIS — Z23 Encounter for immunization: Secondary | ICD-10-CM | POA: Diagnosis not present

## 2016-08-03 DIAGNOSIS — J683 Other acute and subacute respiratory conditions due to chemicals, gases, fumes and vapors: Secondary | ICD-10-CM | POA: Diagnosis not present

## 2016-08-03 NOTE — Patient Instructions (Addendum)
Please set up with a dermatologist--I recommend Dr [facility]ia [facility] or [facility]--both in [place].  DASH Eating Plan DASH stands for "Dietary Approaches to Stop Hypertension." The DASH eating plan is a healthy eating plan that has been shown to reduce high blood pressure (hypertension). Additional health benefits may include reducing the risk of type 2 diabetes mellitus, heart disease, and stroke. The DASH eating plan may also help with weight loss. WHAT DO I NEED TO KNOW ABOUT THE DASH EATING PLAN? For the DASH eating plan, you will follow these general guidelines:  Choose foods with a percent daily value for sodium of less than 5% (as listed on the food label).  Use salt-free seasonings or herbs instead of table salt or sea salt.  Check with your health care provider or pharmacist before using salt substitutes.  Eat lower-sodium products, often labeled as "lower sodium" or "no salt added."  Eat fresh foods.  Eat more vegetables, fruits, and low-fat dairy products.  Choose whole grains. Look for the word "whole" as the first word in the ingredient list.  Choose fish and skinless chicken or [place] more often than red meat. Limit fish, poultry, and meat to 6 oz (170 g) each day.  Limit sweets, desserts, sugars, and sugary drinks.  Choose heart-healthy fats.  Limit cheese to 1 oz (28 g) per day.  Eat more home-cooked food and less restaurant, buffet, and fast food.  Limit fried foods.  Cook foods using methods other than frying.  Limit canned vegetables. If you do use them, rinse them well to decrease the sodium.  When eating at a restaurant, ask that your food be prepared with less salt, or no salt if possible. WHAT FOODS CAN I EAT? Seek help from a dietitian for individual calorie needs. Grains Whole grain or whole wheat bread. Brown rice. Whole grain or whole wheat pasta. Quinoa, bulgur, and whole grain cereals. Low-sodium cereals. Corn or whole wheat flour  tortillas. Whole grain cornbread. Whole grain crackers. Low-sodium crackers. Vegetables Fresh or frozen vegetables (raw, steamed, roasted, or grilled). Low-sodium or reduced-sodium tomato and vegetable juices. Low-sodium or reduced-sodium tomato sauce and paste. Low-sodium or reduced-sodium canned vegetables.  Fruits All fresh, canned (in natural juice), or frozen fruits. Meat and Other Protein Products Ground beef (85% or leaner), grass-fed beef, or beef trimmed of fat. Skinless chicken or [place]. Ground chicken or [place]. Pork trimmed of fat. All fish and seafood. Eggs. Dried beans, peas, or lentils. Unsalted nuts and seeds. Unsalted canned beans. Dairy Low-fat dairy products, such as skim or 1% milk, 2% or reduced-fat cheeses, low-fat ricotta or cottage cheese, or plain low-fat yogurt. Low-sodium or reduced-sodium cheeses. Fats and Oils Tub margarines without trans fats. Light or reduced-fat mayonnaise and salad dressings (reduced sodium). Avocado. Safflower, olive, or canola oils. Natural peanut or almond butter. Other Unsalted popcorn and pretzels. The items listed above may not be a complete list of recommended foods or beverages. Contact your dietitian for more options. WHAT FOODS ARE NOT RECOMMENDED? Grains White bread. White pasta. White rice. Refined cornbread. Bagels and croissants. Crackers that contain trans fat. Vegetables Creamed or fried vegetables. Vegetables in a cheese sauce. Regular canned vegetables. Regular canned tomato sauce and paste. Regular tomato and vegetable juices. Fruits Dried fruits. Canned fruit in light or heavy syrup. Fruit juice. Meat and Other Protein Products Fatty cuts of meat. Ribs, chicken wings, bacon, sausage, bologna, salami, chitterlings, fatback, hot dogs, bratwurst, and packaged luncheon meats. Salted nuts and seeds. Canned beans with salt.  Dairy Whole or 2% milk, cream, half-and-half, and cream cheese. Whole-fat or sweetened yogurt. Full-fat  cheeses or blue cheese. Nondairy creamers and whipped toppings. Processed cheese, cheese spreads, or cheese curds. Condiments Onion and garlic salt, seasoned salt, table salt, and sea salt. Canned and packaged gravies. Worcestershire sauce. Tartar sauce. Barbecue sauce. Teriyaki sauce. Soy sauce, including reduced sodium. Steak sauce. Fish sauce. Oyster sauce. Cocktail sauce. Horseradish. Ketchup and mustard. Meat flavorings and tenderizers. Bouillon cubes. Hot sauce. Tabasco sauce. Marinades. Taco seasonings. Relishes. Fats and Oils Butter, stick margarine, lard, shortening, ghee, and bacon fat. Coconut, palm kernel, or palm oils. Regular salad dressings. Other Pickles and olives. Salted popcorn and pretzels. The items listed above may not be a complete list of foods and beverages to avoid. Contact your dietitian for more information. WHERE CAN I FIND MORE INFORMATION? National Heart, Lung, and Blood Institute: travelstabloid.com   This information is not intended to replace advice given to you by your health care provider. Make sure you discuss any questions you have with your health care provider.   Document Released: 11/09/2011 Document Revised: 12/11/2014 Document Reviewed: 09/24/2013 Elsevier Interactive Patient Education [employer].

## 2016-08-03 NOTE — Addendum Note (Signed)
Addended by: Pilar Grammes on: 08/03/2016 04:01 PM   Modules accepted: Orders

## 2016-08-03 NOTE — Assessment & Plan Note (Signed)
Does okay with regular inhaler--he will discuss with pharmacist if anything cheaper

## 2016-08-03 NOTE — Progress Notes (Signed)
Pre visit review using our clinic review tool, if applicable. No additional management support is needed unless otherwise documented below in the visit note. 

## 2016-08-03 NOTE — Progress Notes (Signed)
Subjective:    Patient ID: Paul Holland, male    DOB: [birth year], 45 y.o.   MRN: [id number]  HPI Here for physical  He feels well Continues on the inhaler--wonders about alternative (due to cough) Will skip days ---other than allergy season Uses the loratadine year round  Tries to walk regularly 12K steps per day  Current Outpatient Prescriptions on File Prior to Visit  Medication Sig Dispense Refill  . Fluticasone-Salmeterol (ADVAIR DISKUS) 250-50 MCG/DOSE AEPB Inhale 1 puff into the lungs 2 (two) times daily. 60 each 1  . loratadine (CLARITIN) 10 MG tablet Take 10-20 mg by mouth daily.     No current facility-administered medications on file prior to visit.     No Known Allergies  Past Medical History:  Diagnosis Date  . Allergic rhinitis due to pollen   . 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 Heart murmur    probably mild MR. Sees Dr Nehemiah Massed  . Osteoarthritis of wrist     Past Surgical History:  Procedure Laterality Date  . Cartilage removed from ribs  1981  . KNEE ARTHROSCOPY WITH ANTERIOR CRUCIATE LIGAMENT (ACL) REPAIR  6/14 & 8/14   meniscus repair and tibial fracture repair (bone graft). ACL not repaired    Family History  Problem Relation Age of Onset  . Diabetes Brother   . Hyperlipidemia Brother   . Hypertension Brother   . Cancer Paternal Uncle     colon  . Cancer Paternal Grandfather   . Heart disease Neg Hx     Social History   Social History  . Marital status: Married    Spouse name: N/A  . Number of children: 3  . Years of education: N/A   Occupational History  . Supervisor-- Engineering geologist   Social History Main Topics  . Smoking status: Former Research scientist (life sciences)  . Smokeless tobacco: Never Used  . Alcohol use Yes  . Drug use: No  . Sexual activity: Not on file   Other Topics Concern  . Not on file   Social History Narrative   2nd marriage   3 children who live with mother in [place]----regular  visits to them         Review of Systems  Constitutional: Negative for fatigue and unexpected weight change.       Wears seat belt  HENT: Positive for tinnitus. Negative for dental problem and hearing loss.        Keeps up with dentist  Eyes: Negative for visual disturbance.       No diplopia or unilateral vision loss  Respiratory: Positive for cough and shortness of breath. Negative for chest tightness.   Cardiovascular: Negative for chest pain.       Rare flutter in heart  Gastrointestinal: Negative for blood in stool, constipation, nausea and vomiting.       Occasional hiccup---no heartburn  Endocrine: Negative for polydipsia and polyuria.  Genitourinary: Negative for urgency.       Slight dribbling No sexual problems  Musculoskeletal: Positive for arthralgias and back pain.       Chronic right leg pain since injury--will occasionally swell Chiropractor helps-- Dr Rock Nephew  Skin: Negative for rash.       Multiple moles--no recent changes. Recommended dermatologist  Allergic/Immunologic: Positive for environmental allergies. Negative for immunocompromised state.  Neurological: Negative for dizziness, syncope, weakness and light-headedness.       Rare headache  Hematological: Negative for adenopathy.  Does not bruise/bleed easily.  Psychiatric/Behavioral: Negative for dysphoric mood. The patient is not nervous/anxious.        Chronic sleep problems during the week--3rd shift.        Objective:   Physical Exam  Constitutional: He is oriented to person, place, and time. He appears well-developed and well-nourished. No distress.  HENT:  Head: Normocephalic and atraumatic.  Right Ear: External ear normal.  Left Ear: External ear normal.  Mouth/Throat: Oropharynx is clear and moist. No oropharyngeal exudate.  Eyes: Conjunctivae are normal. Pupils are equal, round, and reactive to light.  Neck: Normal range of motion. Neck supple. No thyromegaly present.  Cardiovascular: Normal  rate, regular rhythm, normal heart sounds and intact distal pulses.  Exam reveals no gallop.   No murmur heard. Pulmonary/Chest: Effort normal and breath sounds normal. No respiratory distress. He has no wheezes. He has no rales.  Abdominal: Soft. There is no tenderness.  Musculoskeletal: He exhibits no edema.  Lymphadenopathy:    He has no cervical adenopathy.  Neurological: He is alert and oriented to person, place, and time.  Skin: No rash noted. No erythema.  Many benign (but close to 44mm) nevi  Psychiatric: He has a normal mood and affect. His behavior is normal.          Assessment & Plan:

## 2016-08-03 NOTE — Assessment & Plan Note (Signed)
Generally healthy Will give prevnar and flu vaccines given pulmonary history Discussed fitness No cancer screening till 18

## 2016-08-04 LAB — COMPREHENSIVE METABOLIC PANEL
ALBUMIN: 4.6 g/dL (ref 3.5–5.2)
ALK PHOS: 79 U/L (ref 39–117)
ALT: 50 U/L (ref 0–53)
AST: 36 U/L (ref 0–37)
BILIRUBIN TOTAL: 0.7 mg/dL (ref 0.2–1.2)
BUN: 11 mg/dL (ref 6–23)
CO2: 30 mEq/L (ref 19–32)
CREATININE: 1.08 mg/dL (ref 0.40–1.50)
Calcium: 9.4 mg/dL (ref 8.4–10.5)
Chloride: 101 mEq/L (ref 96–112)
GFR: 78.3 mL/min (ref >60.00)
GLUCOSE: 80 mg/dL (ref 70–99)
Potassium: 4.4 mEq/L (ref 3.5–5.1)
SODIUM: 137 meq/L (ref 135–145)
TOTAL PROTEIN: 7.9 g/dL (ref 6.0–8.3)

## 2016-08-04 LAB — CBC WITH DIFFERENTIAL/PLATELET
BASOS ABS: 0 10*3/uL (ref 0.0–0.1)
Basophils Relative: 0.4 % (ref 0.0–3.0)
EOS ABS: 0.1 10*3/uL (ref 0.0–0.7)
Eosinophils Relative: 1.7 % (ref 0.0–5.0)
HCT: 49.9 % (ref 39.0–52.0)
Hemoglobin: 17.1 g/dL — ABNORMAL HIGH (ref 13.0–17.0)
LYMPHS ABS: 2.4 10*3/uL (ref 0.7–4.0)
Lymphocytes Relative: 35.4 % (ref 12.0–46.0)
MCHC: 34.2 g/dL (ref 30.0–36.0)
MCV: 94.9 fl (ref 78.0–100.0)
MONO ABS: 0.6 10*3/uL (ref 0.1–1.0)
Monocytes Relative: 8 % (ref 3.0–12.0)
NEUTROS PCT: 54.5 % (ref 43.0–77.0)
Neutro Abs: 3.7 10*3/uL (ref 1.4–7.7)
Platelets: 269 10*3/uL (ref 150.0–400.0)
RBC: 5.26 Mil/uL (ref 4.22–5.81)
RDW: 13.7 % (ref 11.5–15.5)
WBC: 6.9 10*3/uL (ref 4.0–10.5)

## 2016-08-07 ENCOUNTER — Encounter: Payer: Self-pay | Admitting: Internal Medicine

## 2016-08-08 MED ORDER — CEPHALEXIN 500 MG PO CAPS: 500.0000 mg | ORAL_CAPSULE | Freq: Three times a day (TID) | ORAL | 0 refills | Status: DC

## 2016-08-11 ENCOUNTER — Ambulatory Visit (INDEPENDENT_AMBULATORY_CARE_PROVIDER_SITE_OTHER): Payer: BLUE CROSS/BLUE SHIELD | Admitting: Internal Medicine

## 2016-08-11 ENCOUNTER — Encounter: Payer: Self-pay | Admitting: Internal Medicine

## 2016-08-11 DIAGNOSIS — L03032 Cellulitis of left toe: Secondary | ICD-10-CM

## 2016-08-11 MED ORDER — CEPHALEXIN 500 MG PO CAPS: 500.0000 mg | ORAL_CAPSULE | Freq: Three times a day (TID) | ORAL | 1 refills | Status: DC

## 2016-08-11 NOTE — Progress Notes (Signed)
   Subjective:    Patient ID: Paul Holland, male    DOB: 1971/12/04, 45 y.o.   MRN: [id number]  HPI Here for toe pain and possible infection  Started with left great toe pain while at beach 5-6 days ago Got very puffy Soaking it Pushed it and got some pus out  Directly at medial nail Has improved with the antibiotic Only has mild intermittent symptoms of ingrown nail  Current Outpatient Prescriptions on File Prior to Visit  Medication Sig Dispense Refill  . cephALEXin (KEFLEX) 500 MG capsule Take 1 capsule (500 mg total) by mouth 3 (three) times daily. 15 capsule 0  . Fluticasone-Salmeterol (ADVAIR DISKUS) 250-50 MCG/DOSE AEPB Inhale 1 puff into the lungs 2 (two) times daily. 60 each 1  . loratadine (CLARITIN) 10 MG tablet Take 10-20 mg by mouth daily.     No current facility-administered medications on file prior to visit.     No Known Allergies  Past Medical History:  Diagnosis Date  . Allergic rhinitis due to pollen   . 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 Heart murmur    probably mild MR. Sees Dr Nehemiah Massed  . Osteoarthritis of wrist     Past Surgical History:  Procedure Laterality Date  . Cartilage removed from ribs  1981  . KNEE ARTHROSCOPY WITH ANTERIOR CRUCIATE LIGAMENT (ACL) REPAIR  6/14 & 8/14   meniscus repair and tibial fracture repair (bone graft). ACL not repaired    Family History  Problem Relation Age of Onset  . Diabetes Brother   . Hyperlipidemia Brother   . Hypertension Brother   . Cancer Paternal Uncle     colon  . Cancer Paternal Grandfather   . Heart disease Neg Hx     Social History   Social History  . Marital status: Married    Spouse name: N/A  . Number of children: 3  . Years of education: N/A   Occupational History  . Supervisor-- Engineering geologist   Social History Main Topics  . Smoking status: Former Research scientist (life sciences)  . Smokeless tobacco: Never Used  . Alcohol use Yes  . Drug use: No  .  Sexual activity: Not on file   Other Topics Concern  . Not on file   Social History Narrative   2nd marriage   3 children who live with mother in [place]----regular visits to them         Review of Systems No fever Doesn't feel sick No other joint swelling    Objective:   Physical Exam  Constitutional: He appears well-developed and well-nourished. No distress.  Skin:  Swelling and redness along distal lateral left great toenail Not particularly tender now No discharge          Assessment & Plan:

## 2016-08-11 NOTE — Progress Notes (Signed)
Pre visit review using our clinic review tool, if applicable. No additional management support is needed unless otherwise documented below in the visit note. 

## 2016-08-11 NOTE — Assessment & Plan Note (Signed)
Better with soaks and starting the cephalexin Will refill the antibiotic if it recurs Discussed him trimming the nail back--consider wedge resection if recurrent

## 2016-09-24 ENCOUNTER — Encounter: Payer: Self-pay | Admitting: Internal Medicine

## 2016-09-29 ENCOUNTER — Ambulatory Visit (INDEPENDENT_AMBULATORY_CARE_PROVIDER_SITE_OTHER): Payer: BLUE CROSS/BLUE SHIELD | Admitting: Family Medicine

## 2016-09-29 ENCOUNTER — Encounter: Payer: Self-pay | Admitting: Podiatry

## 2016-09-29 ENCOUNTER — Ambulatory Visit (INDEPENDENT_AMBULATORY_CARE_PROVIDER_SITE_OTHER): Payer: BLUE CROSS/BLUE SHIELD | Admitting: Podiatry

## 2016-09-29 ENCOUNTER — Encounter: Payer: Self-pay | Admitting: Family Medicine

## 2016-09-29 VITALS — BP 139/94 | HR 73

## 2016-09-29 VITALS — BP 118/78 | HR 72 | Temp 97.8°F | Wt 250.0 lb

## 2016-09-29 DIAGNOSIS — L03032 Cellulitis of left toe: Secondary | ICD-10-CM

## 2016-09-29 DIAGNOSIS — L03039 Cellulitis of unspecified toe: Secondary | ICD-10-CM

## 2016-09-29 DIAGNOSIS — M79676 Pain in unspecified toe(s): Secondary | ICD-10-CM | POA: Diagnosis not present

## 2016-09-29 DIAGNOSIS — L600 Ingrowing nail: Secondary | ICD-10-CM

## 2016-09-29 NOTE — Progress Notes (Signed)
   BP 118/78   Pulse 72   Temp 97.8 F (36.6 C) (Oral)   Wt 250 lb (113.4 kg)   BMI 35.11 kg/m    CC: check ingrown toe Subjective:    Patient ID: [name], male    DOB: 13-Dec-1970, 45 y.o.   MRN: [id number]  HPI: Paul Holland is a 45 y.o. male presenting on 09/29/2016 for Ingrown Toenail   Seen here last month by PCP with acute great toe paronychia, treated with keflex. This improved.   Symptoms recurred over the past week. Started antibiotics 2 wks ago.   Ongoing problem.   Relevant past medical, surgical, family and social history reviewed and updated as indicated. Interim medical history since our last visit reviewed. Allergies and medications reviewed and updated. Current Outpatient Prescriptions on File Prior to Visit  Medication Sig  . cephALEXin (KEFLEX) 500 MG capsule Take 1 capsule (500 mg total) by mouth 3 (three) times daily.  . Fluticasone-Salmeterol (ADVAIR DISKUS) 250-50 MCG/DOSE AEPB Inhale 1 puff into the lungs 2 (two) times daily.  Marland Kitchen loratadine (CLARITIN) 10 MG tablet Take 10-20 mg by mouth daily.   No current facility-administered medications on file prior to visit.     Review of Systems Per HPI unless specifically indicated in ROS section     Objective:    BP 118/78   Pulse 72   Temp 97.8 F (36.6 C) (Oral)   Wt 250 lb (113.4 kg)   BMI 35.11 kg/m   Wt Readings from Last 3 Encounters:  09/29/16 250 lb (113.4 kg)  08/11/16 247 lb (112 kg)  08/03/16 244 lb (110.7 kg)    Physical Exam  Constitutional: He appears well-developed and well-nourished. No distress.  Musculoskeletal:  L great toe with medial and lateral ingrown toenail with acute paronychia.   Nursing note and vitals reviewed.     Assessment & Plan:   Problem List Items Addressed This Visit    Ingrown toenail - Primary    Ongoing issue, currently on keflex. Refer to podiatry as will likely need bilateral toenail wedge resections. Pt and wife agree with plan.      Relevant Orders   Ambulatory referral to Podiatry   Paronychia of great toe, left   Relevant Orders   Ambulatory referral to Podiatry    Other Visit Diagnoses   None.      Follow up plan: Return if symptoms worsen or fail to improve.  Ria Bush, MD

## 2016-09-29 NOTE — Patient Instructions (Signed)

## 2016-09-29 NOTE — Progress Notes (Signed)
Pre visit review using our clinic review tool, if applicable. No additional management support is needed unless otherwise documented below in the visit note. 

## 2016-09-29 NOTE — Patient Instructions (Signed)
No charge visit - we will send you to podiatrist.  Ingrown Toenail An ingrown toenail occurs when the corner or sides of your toenail grow into the surrounding skin. The big toe is most commonly affected, but it can happen to any of your toes. If your ingrown toenail is not treated, you will be at risk for infection. CAUSES This condition may be caused by:  Wearing shoes that are too small or tight.  Injury or trauma, such as stubbing your toe or having your toe stepped on.  Improper cutting or care of your toenails.  Being born with (congenital) nail or foot abnormalities, such as having a nail that is too big for your toe. RISK FACTORS Risk factors for an ingrown toenail include:  Age. Your nails tend to thicken as you get older, so ingrown nails are more common in older people.  Diabetes.  Cutting your toenails incorrectly.  Blood circulation problems. SYMPTOMS Symptoms may include:  Pain, soreness, or tenderness.  Redness.  Swelling.  Hardening of the skin surrounding the toe. Your ingrown toenail may be infected if there is fluid, pus, or drainage. DIAGNOSIS  An ingrown toenail may be diagnosed by medical history and physical exam. If your toenail is infected, your health care provider may test a sample of the drainage. TREATMENT Treatment depends on the severity of your ingrown toenail. Some ingrown toenails may be treated at home. More severe or infected ingrown toenails may require surgery to remove all or part of the nail. Infected ingrown toenails may also be treated with antibiotic medicines. HOME CARE INSTRUCTIONS  If you were prescribed an antibiotic medicine, finish all of it even if you start to feel better.  Soak your foot in warm soapy water for 20 minutes, 3 times per day or as directed by your health care provider.  Carefully lift the edge of the nail away from the sore skin by wedging a small piece of cotton under the corner of the nail. This may help  with the pain. Be careful not to cause more injury to the area.  Wear shoes that fit well. If your ingrown toenail is causing you pain, try wearing sandals, if possible.  Trim your toenails regularly and carefully. Do not cut them in a curved shape. Cut your toenails straight across. This prevents injury to the skin at the corners of the toenail.  Keep your feet clean and dry.  If you are having trouble walking and are given crutches by your health care provider, use them as directed.  Do not pick at your toenail or try to remove it yourself.  Take medicines only as directed by your health care provider.  Keep all follow-up visits as directed by your health care provider. This is important. SEEK MEDICAL CARE IF:  Your symptoms do not improve with treatment. SEEK IMMEDIATE MEDICAL CARE IF:  You have red streaks that start at your foot and go up your leg.  You have a fever.  You have increased redness, swelling, or pain.  You have fluid, blood, or pus coming from your toenail.   This information is not intended to replace advice given to you by your health care provider. Make sure you discuss any questions you have with your health care provider.   Document Released: 11/17/2000 Document Revised: 04/06/2015 Document Reviewed: 10/14/2014 Elsevier Interactive Patient Education [employer].

## 2016-09-29 NOTE — Assessment & Plan Note (Signed)
Ongoing issue, currently on keflex. Refer to podiatry as will likely need bilateral toenail wedge resections. Pt and wife agree with plan.

## 2016-09-29 NOTE — Progress Notes (Signed)
   Subjective:    Patient ID: Paul Holland, male    DOB: [birth year], 45 y.o.   MRN: [id number]  HPI    Review of Systems  Musculoskeletal: Positive for back pain.  All other systems reviewed and are negative.      Objective:   Physical Exam        Assessment & Plan:

## 2016-10-08 NOTE — Progress Notes (Signed)
Patient ID: Paul Holland, male   DOB: 07/11/1971, 45 y.o.   MRN: [id number] Subjective: Patient presents today for evaluation of pain in her toe(s). Patient is concerned for possible ingrown nail. Patient states that the pain has been present for a few weeks now. Patient presents today for further treatment and evaluation.  Objective:  General: Well developed, nourished, in no acute distress, alert and oriented x3   Dermatology: Skin is warm, dry and supple bilateral. Medial and lateral borders of the left great toe appears to be erythematous with evidence of an ingrowing nail. Purulent drainage noted with intruding nail into the respective nail fold. Pain on palpation noted to the border of the nail fold. The remaining nails appear unremarkable at this time. There are no open sores, lesions.  Vascular: Dorsalis Pedis artery and Posterior Tibial artery pedal pulses palpable. No lower extremity edema noted.   Neruologic: Grossly intact via light touch bilateral.  Musculoskeletal: Muscular strength within normal limits in all groups bilateral. Normal range of motion noted to all pedal and ankle joints.   Assesement: #1 ingrown nail medial lateral borders left great toe #2 paronychia both medial and lateral borders left great toe #3 pain in left great toe   Plan of Care:  1. Patient evaluated.  2. Discussed treatment alternatives and plan of care. Explained nail avulsion procedure and post procedure course to patient. 3. Patient opted for permanent partial nail avulsion.  4. Prior to procedure, local anesthesia infiltration utilized using 3 ml of a 50:50 mixture of 2% plain lidocaine and 0.5% plain marcaine in a normal hallux block fashion and a betadine prep performed.  5. Partial permanent nail avulsion with chemical matrixectomy performed using [id number] applications of phenol followed by alcohol flush.  6. Light dressing applied. 7. Return to clinic in 2 weeks.   Dr. Edrick Kins [facility]

## 2016-10-10 ENCOUNTER — Ambulatory Visit: Payer: BLUE CROSS/BLUE SHIELD | Admitting: Podiatry

## 2016-10-13 ENCOUNTER — Encounter: Payer: Self-pay | Admitting: Podiatry

## 2016-10-13 ENCOUNTER — Ambulatory Visit (INDEPENDENT_AMBULATORY_CARE_PROVIDER_SITE_OTHER): Payer: BLUE CROSS/BLUE SHIELD | Admitting: Podiatry

## 2016-10-13 DIAGNOSIS — L03039 Cellulitis of unspecified toe: Secondary | ICD-10-CM

## 2016-10-13 DIAGNOSIS — M79676 Pain in unspecified toe(s): Secondary | ICD-10-CM

## 2016-10-13 DIAGNOSIS — S91109D Unspecified open wound of unspecified toe(s) without damage to nail, subsequent encounter: Secondary | ICD-10-CM

## 2016-10-13 NOTE — Progress Notes (Signed)

## 2016-12-11 ENCOUNTER — Encounter: Payer: Self-pay | Admitting: Primary Care

## 2016-12-11 ENCOUNTER — Ambulatory Visit (INDEPENDENT_AMBULATORY_CARE_PROVIDER_SITE_OTHER): Payer: BLUE CROSS/BLUE SHIELD | Admitting: Primary Care

## 2016-12-11 VITALS — BP 144/86 | HR 102 | Temp 98.7°F | Wt 251.1 lb

## 2016-12-11 DIAGNOSIS — J029 Acute pharyngitis, unspecified: Secondary | ICD-10-CM | POA: Diagnosis not present

## 2016-12-11 LAB — POCT RAPID STREP A (OFFICE): Rapid Strep A Screen: NEGATIVE

## 2016-12-11 NOTE — Progress Notes (Signed)
Pre visit review using our clinic review tool, if applicable. No additional management support is needed unless otherwise documented below in the visit note. 

## 2016-12-11 NOTE — Patient Instructions (Signed)
Your strep test is negative.  Continue Claritin, Mucinex. You may also try Ibuprofen 600 mg every 8 hours as needed for pain and inflammation.  Please notify me if you develop persistent fevers of 101, start coughing up green mucous, notice increased fatigue or weakness, or feel worse after 1 week of onset of symptoms.   Increase consumption of water intake and rest.  It was a pleasure meeting you!

## 2016-12-11 NOTE — Addendum Note (Signed)
Addended by: Jacqualin Combes on: 12/11/2016 10:34 AM   Modules accepted: Orders

## 2016-12-11 NOTE — Progress Notes (Signed)
Subjective:    Patient ID: Paul Holland, male    DOB: 05-25-1971, 46 y.o.   MRN: [id number]  HPI  Mr. Bish is a 46 year old male who presents today with a chief complaint of sore throat. He also reports fever, nasal congestion, non-productive cough. His symptoms began 3-4 days ago with congestion, his sore throat began 2 days ago. His wife had strep 2-3 weeks ago. He's recently been traveling and returned 1 week ago. He's taken Mucinex with some improvement. His fevers are low grade. He felt well all last week.   Review of Systems  Constitutional: Positive for chills, fatigue and fever.  HENT: Positive for congestion, postnasal drip and sore throat.   Respiratory: Positive for cough. Negative for shortness of breath.        Past Medical History:  Diagnosis Date  . Allergic rhinitis due to pollen   . 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 Heart murmur    probably mild MR. Sees Dr Nehemiah Massed  . Osteoarthritis of wrist      Social History   Social History  . Marital status: Married    Spouse name: N/A  . Number of children: 3  . Years of education: N/A   Occupational History  . Supervisor-- Engineering geologist   Social History Main Topics  . Smoking status: Former Research scientist (life sciences)  . Smokeless tobacco: Never Used  . Alcohol use Yes  . Drug use: No  . Sexual activity: Not on file   Other Topics Concern  . Not on file   Social History Narrative   2nd marriage   3 children who live with mother in [place]----regular visits to them          Past Surgical History:  Procedure Laterality Date  . Cartilage removed from ribs  1981  . KNEE ARTHROSCOPY WITH ANTERIOR CRUCIATE LIGAMENT (ACL) REPAIR  6/14 & 8/14   meniscus repair and tibial fracture repair (bone graft). ACL not repaired    Family History  Problem Relation Age of Onset  . Diabetes Brother   . Hyperlipidemia Brother   . Hypertension Brother   . Cancer Paternal Uncle     colon    . Cancer Paternal Grandfather   . Heart disease Neg Hx     No Known Allergies  Current Outpatient Prescriptions on File Prior to Visit  Medication Sig Dispense Refill  . Fluticasone-Salmeterol (ADVAIR DISKUS) 250-50 MCG/DOSE AEPB Inhale 1 puff into the lungs 2 (two) times daily. 60 each 1  . loratadine (CLARITIN) 10 MG tablet Take 10-20 mg by mouth daily.     No current facility-administered medications on file prior to visit.     BP (!) 144/86   Pulse (!) 102   Temp 98.7 F (37.1 C) (Oral)   Wt 251 lb 1.9 oz (113.9 kg)   SpO2 95%   BMI 35.27 kg/m    Objective:   Physical Exam  Constitutional: He appears well-nourished.  HENT:  Right Ear: Ear canal normal. Tympanic membrane is bulging. Tympanic membrane is not erythematous.  Left Ear: Ear canal normal. Tympanic membrane is retracted. Tympanic membrane is not erythematous.  Nose: No mucosal edema. Right sinus exhibits no maxillary sinus tenderness and no frontal sinus tenderness. Left sinus exhibits no maxillary sinus tenderness and no frontal sinus tenderness.  Mouth/Throat: Posterior oropharyngeal erythema present. No oropharyngeal exudate or posterior oropharyngeal edema.  Eyes: Conjunctivae are normal.  Neck: Neck  supple.  Cardiovascular: Normal rate and regular rhythm.   Pulmonary/Chest: Effort normal and breath sounds normal. He has no wheezes. He has no rales.  Skin: Skin is warm and dry.          Assessment & Plan:  Sore Throat:  Also with nasal congestion, post nasal drip, low grade fevers. Improvement with congestion with Mucinex. Exam today with erythema, mild edema, no exudate. Rapid Strep: Negative. Suspect viral involvement, will treat with supportive measures. Continue Claritin, Mucinex. Discussed warm salt gargles. Return precautions provided.  Sheral Flow, NP

## 2016-12-12 ENCOUNTER — Encounter: Payer: Self-pay | Admitting: Primary Care

## 2016-12-15 ENCOUNTER — Other Ambulatory Visit: Payer: Self-pay | Admitting: Primary Care

## 2016-12-15 ENCOUNTER — Encounter: Payer: Self-pay | Admitting: Primary Care

## 2016-12-15 DIAGNOSIS — J069 Acute upper respiratory infection, unspecified: Secondary | ICD-10-CM

## 2016-12-15 MED ORDER — AMOXICILLIN 875 MG PO TABS: 875.0000 mg | ORAL_TABLET | Freq: Two times a day (BID) | ORAL | 0 refills | Status: DC

## 2017-02-23 ENCOUNTER — Ambulatory Visit: Payer: Self-pay | Admitting: Family Medicine

## 2017-06-22 ENCOUNTER — Ambulatory Visit (INDEPENDENT_AMBULATORY_CARE_PROVIDER_SITE_OTHER): Payer: BLUE CROSS/BLUE SHIELD | Admitting: Internal Medicine

## 2017-06-22 ENCOUNTER — Encounter: Payer: Self-pay | Admitting: Internal Medicine

## 2017-06-22 VITALS — BP 108/86 | HR 78 | Temp 98.0°F | Wt 244.0 lb

## 2017-06-22 DIAGNOSIS — R5383 Other fatigue: Secondary | ICD-10-CM

## 2017-06-22 LAB — CBC WITH DIFFERENTIAL/PLATELET
Basophils Absolute: 0.1 10*3/uL (ref 0.0–0.1)
Basophils Relative: 1 % (ref 0.0–3.0)
EOS ABS: 0.1 10*3/uL (ref 0.0–0.7)
Eosinophils Relative: 1.8 % (ref 0.0–5.0)
HCT: 45.4 % (ref 39.0–52.0)
Hemoglobin: 15.7 g/dL (ref 13.0–17.0)
LYMPHS ABS: 2.5 10*3/uL (ref 0.7–4.0)
Lymphocytes Relative: 40.3 % (ref 12.0–46.0)
MCHC: 34.6 g/dL (ref 30.0–36.0)
MCV: 94.8 fl (ref 78.0–100.0)
MONO ABS: 0.5 10*3/uL (ref 0.1–1.0)
Monocytes Relative: 8.4 % (ref 3.0–12.0)
NEUTROS PCT: 48.5 % (ref 43.0–77.0)
Neutro Abs: 3 10*3/uL (ref 1.4–7.7)
Platelets: 256 10*3/uL (ref 150.0–400.0)
RBC: 4.79 Mil/uL (ref 4.22–5.81)
RDW: 13.1 % (ref 11.5–15.5)
WBC: 6.2 10*3/uL (ref 4.0–10.5)

## 2017-06-22 LAB — T4, FREE: FREE T4: 0.67 ng/dL (ref 0.60–1.60)

## 2017-06-22 LAB — COMPREHENSIVE METABOLIC PANEL
ALK PHOS: 67 U/L (ref 39–117)
ALT: 33 U/L (ref 0–53)
AST: 22 U/L (ref 0–37)
Albumin: 4.3 g/dL (ref 3.5–5.2)
BUN: 13 mg/dL (ref 6–23)
CO2: 29 mEq/L (ref 19–32)
CREATININE: 0.99 mg/dL (ref 0.40–1.50)
Calcium: 9.3 mg/dL (ref 8.4–10.5)
Chloride: 104 mEq/L (ref 96–112)
GFR: 86.24 mL/min (ref >60.00)
GLUCOSE: 88 mg/dL (ref 70–99)
Potassium: 4.1 mEq/L (ref 3.5–5.1)
SODIUM: 139 meq/L (ref 135–145)
TOTAL PROTEIN: 7.1 g/dL (ref 6.0–8.3)
Total Bilirubin: 0.6 mg/dL (ref 0.2–1.2)

## 2017-06-22 LAB — SEDIMENTATION RATE: Sed Rate: 6 mm/hr (ref 0–15)

## 2017-06-22 NOTE — Progress Notes (Signed)
Subjective:    Patient ID: Paul Holland, male    DOB: 1971/10/22, 46 y.o.   MRN: [id number]  HPI Here due to fatigue  For about a month, feels more tired Will get 7 hours of sleep, then get up and be ready to sleep again after 2 hours Several headaches--this is a new thing Some dizziness and "fuzzy" feeling in head Some lightheadedness Struggles coming up with words Very "odd"  Stress level is the same Eating about the same Still exercising regularly  Hasn't missed work No significant mood problems No fever  Current Outpatient Prescriptions on File Prior to Visit  Medication Sig Dispense Refill  . Fluticasone-Salmeterol (ADVAIR DISKUS) 250-50 MCG/DOSE AEPB Inhale 1 puff into the lungs 2 (two) times daily. 60 each 1  . loratadine (CLARITIN) 10 MG tablet Take 10-20 mg by mouth daily.     No current facility-administered medications on file prior to visit.     No Known Allergies  Past Medical History:  Diagnosis Date  . Allergic rhinitis due to pollen   . 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 Heart murmur    probably mild MR. Sees Dr Nehemiah Massed  . Osteoarthritis of wrist     Past Surgical History:  Procedure Laterality Date  . Cartilage removed from ribs  1981  . KNEE ARTHROSCOPY WITH ANTERIOR CRUCIATE LIGAMENT (ACL) REPAIR  6/14 & 8/14   meniscus repair and tibial fracture repair (bone graft). ACL not repaired    Family History  Problem Relation Age of Onset  . Diabetes Brother   . Hyperlipidemia Brother   . Hypertension Brother   . Cancer Paternal Uncle        colon  . Cancer Paternal Grandfather   . Heart disease Neg Hx     Social History   Social History  . Marital status: Married    Spouse name: N/A  . Number of children: 3  . Years of education: N/A   Occupational History  . Supervisor-- Engineering geologist   Social History Main Topics  . Smoking status: Former Research scientist (life sciences)  . Smokeless tobacco: Never Used    . Alcohol use Yes  . Drug use: No  . Sexual activity: Not on file   Other Topics Concern  . Not on file   Social History Narrative   2nd marriage   3 children who live with mother in [place]----regular visits to them         Review of Systems  No clear cut illness---did go to [place] 5 weeks ago and had slight cold Bowels are fine Voiding fine No rash No problems with insect bites No cough or SOB No joint swelling or sig pain No apparent adenopathy     Objective:   Physical Exam  Constitutional: He appears well-developed. No distress.  HENT:  Mouth/Throat: Oropharynx is clear and moist. No oropharyngeal exudate.  Neck: Neck supple. No thyromegaly present.  Cardiovascular: Normal rate, regular rhythm, normal heart sounds and intact distal pulses.  Exam reveals no gallop.   No murmur heard. Pulmonary/Chest: Effort normal and breath sounds normal. No respiratory distress. He has no wheezes. He has no rales.  Abdominal: Soft. He exhibits no distension and no mass. There is no tenderness. There is no rebound and no guarding.  No HSM  Musculoskeletal: He exhibits no edema or tenderness.  No joint swelling  Lymphadenopathy:    He has no cervical adenopathy.  He has no axillary adenopathy.       Right: No inguinal adenopathy present.       Left: No inguinal adenopathy present.  Skin: No rash noted.  Psychiatric: He has a normal mood and affect. His behavior is normal.          Assessment & Plan:

## 2017-06-22 NOTE — Assessment & Plan Note (Signed)
Non specific No worrisome features in history and exam is benign Could be mosquito borne illness---like low grade encephalitis----discussed Will check labs Discussed observation only

## 2017-06-24 ENCOUNTER — Other Ambulatory Visit: Payer: Self-pay | Admitting: Primary Care

## 2017-06-24 DIAGNOSIS — R05 Cough: Secondary | ICD-10-CM

## 2017-06-24 DIAGNOSIS — R059 Cough, unspecified: Secondary | ICD-10-CM

## 2017-08-20 ENCOUNTER — Telehealth: Payer: Self-pay

## 2017-08-20 NOTE — Telephone Encounter (Signed)
Yes, I would recommend he get the pneumovax 23 this year with the flu shot

## 2017-08-20 NOTE — Telephone Encounter (Signed)
Pt left v/m to schedule flushot and also wanted to know if needs pneumonia shot as well. Pt had prevnar 13 on 08/03/16. Please advise. Do you want pt to have flushot and pneumovax; pt is 45 and has dx of chemical induced asthma.

## 2017-08-21 NOTE — Telephone Encounter (Signed)
Lm on pts vm and advised per Dr Silvio Pate. Advised pt he may contact office to schedule.

## 2017-09-03 ENCOUNTER — Telehealth: Payer: Self-pay | Admitting: Internal Medicine

## 2017-09-03 NOTE — Telephone Encounter (Signed)
It should be a CPE because his last was 07/2016. It can go on the nurse schedule but will need Dr Silvio Pate to authorize the Pneumococcal 23 vaccine.

## 2017-09-03 NOTE — Telephone Encounter (Signed)
Pt made appointment for 10/16 see below.  Does he need appointment with dr Silvio Pate or nurse visit  Appointment For: Paul Holland ([id number])  Visit Type: MYCHART OFFICE VISIT (1064)    09/18/2017  8:00 AM 15 mins. Venia Carbon, MD   [facility]    Patient Comments:  Office Visit  Flu shot and pnuemonia booster    Thank you Shirlean Mylar

## 2017-09-17 NOTE — Telephone Encounter (Signed)
I spoke to [facility].  He said patient isn't due for a physical until 2019. He said patient can have flu shot and pneumonia shot.  He said [facility] can do the order for the pneumonia shot. He needs a pneumonia shot due to asthma.

## 2017-09-17 NOTE — Telephone Encounter (Signed)
No order is needed. Just authorization from Dr Silvio Pate that he can get it is all that is needed.

## 2017-09-18 ENCOUNTER — Ambulatory Visit (INDEPENDENT_AMBULATORY_CARE_PROVIDER_SITE_OTHER): Payer: BLUE CROSS/BLUE SHIELD

## 2017-09-18 ENCOUNTER — Encounter: Payer: Self-pay | Admitting: Internal Medicine

## 2017-09-18 ENCOUNTER — Ambulatory Visit: Payer: Self-pay | Admitting: Internal Medicine

## 2017-09-18 DIAGNOSIS — Z23 Encounter for immunization: Secondary | ICD-10-CM | POA: Diagnosis not present

## 2017-09-18 DIAGNOSIS — R200 Anesthesia of skin: Secondary | ICD-10-CM

## 2017-09-24 ENCOUNTER — Other Ambulatory Visit (INDEPENDENT_AMBULATORY_CARE_PROVIDER_SITE_OTHER): Payer: BLUE CROSS/BLUE SHIELD

## 2017-09-24 DIAGNOSIS — R200 Anesthesia of skin: Secondary | ICD-10-CM

## 2017-09-24 LAB — VITAMIN B12: Vitamin B-12: 605 pg/mL (ref 211–911)

## 2017-09-26 ENCOUNTER — Encounter: Payer: Self-pay | Admitting: Internal Medicine

## 2017-09-26 DIAGNOSIS — G629 Polyneuropathy, unspecified: Secondary | ICD-10-CM

## 2017-09-26 LAB — PROTEIN ELECTROPHORESIS, SERUM, WITH REFLEX
ALPHA 2: 0.6 g/dL (ref 0.5–0.9)
Albumin ELP: 4.3 g/dL (ref 3.8–4.8)
Alpha 1: 0.3 g/dL (ref 0.2–0.3)
BETA GLOBULIN: 0.5 g/dL (ref 0.4–0.6)
Beta 2: 0.5 g/dL (ref 0.2–0.5)
Gamma Globulin: 1.1 g/dL (ref 0.8–1.7)
TOTAL PROTEIN: 7.4 g/dL (ref 6.1–8.1)

## 2017-10-01 ENCOUNTER — Encounter: Payer: Self-pay | Admitting: Neurology

## 2017-12-05 ENCOUNTER — Ambulatory Visit: Payer: BLUE CROSS/BLUE SHIELD | Admitting: Neurology

## 2017-12-05 ENCOUNTER — Encounter: Payer: Self-pay | Admitting: Neurology

## 2017-12-05 VITALS — BP 100/70 | HR 77 | Ht 71.0 in | Wt 240.4 lb

## 2017-12-05 DIAGNOSIS — R292 Abnormal reflex: Secondary | ICD-10-CM | POA: Diagnosis not present

## 2017-12-05 DIAGNOSIS — M48061 Spinal stenosis, lumbar region without neurogenic claudication: Secondary | ICD-10-CM

## 2017-12-05 MED ORDER — GABAPENTIN 300 MG PO CAPS: ORAL_CAPSULE | ORAL | 5 refills | Status: DC

## 2017-12-05 MED ORDER — CYCLOBENZAPRINE HCL 5 MG PO TABS: 5.0000 mg | ORAL_TABLET | Freq: Every evening | ORAL | 5 refills | Status: DC | PRN

## 2017-12-05 NOTE — Progress Notes (Signed)
[facility] Clinic Note - Initial Visit   Date: 12/05/17  Paul Holland MRN: [id number] DOB: 24-Jul-1971   Dear Dr. Silvio Pate:  Thank you for your kind referral of Paul Holland for consultation of feet numbness. Although his history is well known to you, please allow [place] to reiterate it for the purpose of our medical record. The patient was accompanied to the clinic by wife who also provides collateral information.     History of Present Illness: Paul Holland is a 47 y.o. left-handed Caucasian male with asthma presenting for evaluation of bilateral feet numbness.    Starting around 2014, he developed low back pain, described as achy and localized over the midline. He develops tingling discomfort and sharp pain which starts in the back and radiates bilaterally down the lateral side of the leg, dorsum of the feet, and balls of the feet.   He started seeing a chiropractor who recommended low back exercises which helped his low back pain some, but did not alleviate any tingling.   Symptoms are worse sitting and improved by standing/walking.  Symptoms usually start with tingling and then evolves into sharp pain.  He has tried OTC NSAIDs which relieves some of his low back pain. He does not take any daily medications for his pain.  Of note, [place] and had significant soft issue injury as well as fractured his proximal lower leg.   Out-side paper records, electronic medical record, and images have been reviewed where available and summarized as:  Lab Results  Component Value Date   ESRSEDRATE 6 06/22/2017   Lab Results  Component Value Date   VITAMINB12 605 09/24/2017   Lab Results  Component Value Date   CREATININE 0.99 06/22/2017   BUN 13 06/22/2017   NA 139 06/22/2017   K 4.1 06/22/2017   CL 104 06/22/2017   CO2 29 06/22/2017     Past Medical History:  Diagnosis Date  . Allergic rhinitis due to pollen   .  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 Heart murmur    probably mild MR. Sees Dr Nehemiah Massed  . Osteoarthritis of wrist     Past Surgical History:  Procedure Laterality Date  . Cartilage removed from ribs  1981  . KNEE ARTHROSCOPY WITH ANTERIOR CRUCIATE LIGAMENT (ACL) REPAIR  6/14 & 8/14   meniscus repair and tibial fracture repair (bone graft). ACL not repaired     Medications:  Outpatient Encounter Medications as of 12/05/2017  Medication Sig  . ADVAIR DISKUS 250-50 MCG/DOSE AEPB INHALE ONE DOSE BY MOUTH TWICE DAILY  . loratadine (CLARITIN) 10 MG tablet Take 10-20 mg by mouth daily.  . Misc Natural Products (TUMERSAID) TABS Take by mouth.  . Multiple Vitamin (MULTI-VITAMINS) TABS Take 1 tablet by mouth daily.  . Omega-3 Fatty Acids (FISH OIL PO) Take by mouth.  . cyclobenzaprine (FLEXERIL) 5 MG tablet Take 1 tablet (5 mg total) by mouth at bedtime as needed for muscle spasms.  Marland Kitchen gabapentin (NEURONTIN) 300 MG capsule Take 1 tablet at bedtime for one week, then increase to 1 tablet twice daily   No facility-administered encounter medications on file as of 12/05/2017.      Allergies: No Known Allergies  Family History: Family History  Problem Relation Age of Onset  . Hypertension Mother   . Hypertension Father   . Diabetes Brother   . Hyperlipidemia Brother   . Hypertension Brother   .  Cancer Paternal Uncle        colon  . Cancer Paternal Grandfather   . Heart disease Neg Hx     Social History: Social History   Tobacco Use  . Smoking status: Former Research scientist (life sciences)  . Smokeless tobacco: Never Used  Substance Use Topics  . Alcohol use: Yes    Comment: Occasionally - sporadic  . Drug use: No   Social History   Social History Narrative   2nd marriage   3 children who live with mother in [place]----regular visits to them.   Lives in a 2 story home.     Education: some college.  Works as a Librarian, academic for a [employer]         Review of Systems:    CONSTITUTIONAL: No fevers, chills, night sweats, or weight loss.   EYES: No visual changes or eye pain ENT: No hearing changes.  No history of nose bleeds.   RESPIRATORY: No cough, wheezing and shortness of breath.   CARDIOVASCULAR: Negative for chest pain, and palpitations.   GI: Negative for abdominal discomfort, blood in stools or black stools.  No recent change in bowel habits.   GU:  No history of incontinence.   MUSCLOSKELETAL: No history of joint pain or swelling.  No myalgias.   SKIN: Negative for lesions, rash, and itching.   HEMATOLOGY/ONCOLOGY: Negative for prolonged bleeding, bruising easily, and swollen nodes.  No history of cancer.   ENDOCRINE: Negative for cold or heat intolerance, polydipsia or goiter.   PSYCH:  No depression or anxiety symptoms.   NEURO: As Above.   Vital Signs:  BP 100/70   Pulse 77   Ht 5\' 11"  (1.803 m)   Wt 240 lb 6 oz (109 kg)   SpO2 96%   BMI 33.53 kg/m    General Medical Exam:   General:  Well appearing, comfortable.   Eyes/ENT: see cranial nerve examination.   Neck: No masses appreciated.  Full range of motion without tenderness.  No carotid bruits. Respiratory:  Clear to auscultation, good air entry bilaterally.   Cardiac:  Regular rate and rhythm, no murmur.   Extremities:  No deformities, edema, or skin discoloration.  Skin:  No rashes or lesions.  Neurological Exam: MENTAL STATUS including orientation to time, place, person, recent and remote memory, attention span and concentration, language, and fund of knowledge is normal.  Speech is not dysarthric.  CRANIAL NERVES: II:  No visual field defects.  Unremarkable fundi.   III-IV-VI: Pupils equal round and reactive to light.  Normal conjugate, extra-ocular eye movements in all directions of gaze.  No nystagmus.  No ptosis.   V:  Normal facial sensation.    VII:  Normal facial symmetry and movements.    VIII:  Normal hearing and vestibular function.   IX-X:  Normal palatal  movement.   XI:  Normal shoulder shrug and head rotation.   XII:  Normal tongue strength and range of motion, no deviation or fasciculation.  MOTOR:  No atrophy, fasciculations or abnormal movements.  No pronator drift.  Tone is normal.    Right Upper Extremity:    Left Upper Extremity:    Deltoid  5/5   Deltoid  5/5   Biceps  5/5   Biceps  5/5   Triceps  5/5   Triceps  5/5   Wrist extensors  5/5   Wrist extensors  5/5   Wrist flexors  5/5   Wrist flexors  5/5   Finger extensors  5/5   Finger extensors  5/5   Finger flexors  5/5   Finger flexors  5/5   Dorsal interossei  5/5   Dorsal interossei  5/5   Abductor pollicis  5/5   Abductor pollicis  5/5   Tone (Ashworth scale)  0  Tone (Ashworth scale)  0   Right Lower Extremity:    Left Lower Extremity:    Hip flexors  5/5   Hip flexors  5/5   Hip extensors  5/5   Hip extensors  5/5   Knee flexors  5/5   Knee flexors  5/5   Knee extensors  5/5   Knee extensors  5/5   Dorsiflexors  5/5   Dorsiflexors  5/5   Plantarflexors  5/5   Plantarflexors  5/5   Toe extensors  5/5   Toe extensors  5/5   Toe flexors  5/5   Toe flexors  5/5   Tone (Ashworth scale)  0  Tone (Ashworth scale)  0   MSRs:  Right                                                                 Left brachioradialis 2+  brachioradialis 2+  biceps 2+  biceps 2+  triceps 2+  triceps 2+  patellar 3+  patellar 3+  ankle jerk 2+  ankle jerk 2+  Hoffman no  Hoffman no  plantar response down  plantar response down   SENSORY:  Normal and symmetric perception of light touch, pinprick, vibration, and proprioception.  Romberg's sign absent.   COORDINATION/GAIT: Normal finger-to- nose-finger and heel-to-shin.  Intact rapid alternating movements bilaterally.  Able to rise from a chair without using arms.  Gait narrow based and stable. Tandem and stressed gait intact.    IMPRESSION: Low back pain due to suspected lumbar canal stenosis/foraminal stenosis.    - MRI lumbar spine wo  contrast   - Start flexeril 5mg  at bedtime as needed for low back pain  - Start gabapentin 300mg  at bedtime x 1 week, then increase to BID  - Start physical therapy for low back stengthening  Return to clinic in 4 months.  Thank you for allowing me to participate in patient's care.  If I can answer any additional questions, I would be pleased to do so.    Sincerely,    Donika K. Posey Pronto, DO

## 2017-12-05 NOTE — Progress Notes (Signed)
Patient notified

## 2017-12-05 NOTE — Patient Instructions (Addendum)
MRI lumbar spine wo contrast  Start tizanidine 2mg  at bedtime for low back pain as needed  Start gabapentin 300mg  at bedtime x 1 week, then increase to 300mg  twice daily  Start physical therapy for low back strengthening  Return to clinic in 4 months

## 2017-12-10 ENCOUNTER — Other Ambulatory Visit: Payer: Self-pay | Admitting: *Deleted

## 2017-12-10 DIAGNOSIS — M48061 Spinal stenosis, lumbar region without neurogenic claudication: Secondary | ICD-10-CM

## 2017-12-10 DIAGNOSIS — R292 Abnormal reflex: Secondary | ICD-10-CM

## 2017-12-12 ENCOUNTER — Ambulatory Visit: Payer: BLUE CROSS/BLUE SHIELD

## 2017-12-17 ENCOUNTER — Telehealth: Payer: Self-pay | Admitting: Neurology

## 2017-12-17 NOTE — Telephone Encounter (Signed)
Patient Paul Holland regarding the Physical Therapist that he had been recommended to is not in network. Is there another PT that may be in his Network. Please Call. Thanks

## 2017-12-18 NOTE — Telephone Encounter (Signed)
Please inform patient that his MRI lumbar spine does not show any nerve impingement or disc herniation.  Because this does not give any explanation for his symptoms, we can look at a level higher in the thoracic spine/mid back to see if there is nerve impingement at this level (MRI thoracic spine without contrast); otherwise, continue physical therapy, gabapentin, and Flexeril and we can hold off on imaging and less symptoms get worse.  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 No herniated disc, spinal stenosis, or foraminal stenosis.  Donika K. Posey Pronto, DO

## 2017-12-18 NOTE — Telephone Encounter (Signed)
Patient given results and would like to continue with PT and medications.  [facility] is out of network so I am switching him to [facility] in [place].

## 2017-12-24 ENCOUNTER — Ambulatory Visit: Payer: BLUE CROSS/BLUE SHIELD | Admitting: Physical Therapy

## 2017-12-26 ENCOUNTER — Ambulatory Visit: Payer: BLUE CROSS/BLUE SHIELD | Admitting: Physical Therapy

## 2017-12-31 ENCOUNTER — Encounter: Payer: BLUE CROSS/BLUE SHIELD | Admitting: Physical Therapy

## 2017-12-31 ENCOUNTER — Ambulatory Visit: Payer: BLUE CROSS/BLUE SHIELD | Admitting: Neurology

## 2018-01-02 ENCOUNTER — Encounter: Payer: BLUE CROSS/BLUE SHIELD | Admitting: Physical Therapy

## 2018-01-02 ENCOUNTER — Other Ambulatory Visit: Payer: Self-pay | Admitting: Internal Medicine

## 2018-01-02 DIAGNOSIS — R05 Cough: Secondary | ICD-10-CM

## 2018-01-02 DIAGNOSIS — R059 Cough, unspecified: Secondary | ICD-10-CM

## 2018-03-07 ENCOUNTER — Encounter: Payer: Self-pay | Admitting: Internal Medicine

## 2018-03-15 ENCOUNTER — Telehealth: Payer: Self-pay | Admitting: Internal Medicine

## 2018-03-15 NOTE — Telephone Encounter (Signed)
Copied from [place] [id number]. Topic: Quick Communication - Rx Refill/Question >> Mar 15, 2018  3:30 PM Tye [place] wrote: Medication: ADVAIR DISKUS 250-50 MCG/DOSE AEPB [[id number]]  Pt called to state that he is needing to have PA done for this medication in order to have it filled, call pt if needed

## 2018-03-18 NOTE — Telephone Encounter (Signed)
I did the PA on Paul Holland. Waiting on response. Tried to call pt. The PA form kept asking is there any reason he cannot use the name brand. Left message for pt to let him know what is going on.

## 2018-03-18 NOTE — Telephone Encounter (Signed)
I have not received anything from the pharmacy and have no idea who to contact to start a PA. Will try to work it in the next few days.

## 2018-03-19 NOTE — Telephone Encounter (Signed)
As I thought, the generic Advair is not covered but the name brand is. Left a message for the pt to let him know that he needs the pharmacy to run it as name brand and there should be no issues.

## 2018-04-03 ENCOUNTER — Other Ambulatory Visit (INDEPENDENT_AMBULATORY_CARE_PROVIDER_SITE_OTHER): Payer: BLUE CROSS/BLUE SHIELD

## 2018-04-03 ENCOUNTER — Encounter: Payer: Self-pay | Admitting: Neurology

## 2018-04-03 ENCOUNTER — Ambulatory Visit: Payer: BLUE CROSS/BLUE SHIELD | Admitting: Neurology

## 2018-04-03 VITALS — BP 104/80 | HR 72 | Ht 71.0 in | Wt 242.4 lb

## 2018-04-03 DIAGNOSIS — G8929 Other chronic pain: Secondary | ICD-10-CM

## 2018-04-03 DIAGNOSIS — M545 Low back pain, unspecified: Secondary | ICD-10-CM

## 2018-04-03 DIAGNOSIS — R202 Paresthesia of skin: Secondary | ICD-10-CM

## 2018-04-03 LAB — FOLATE: Folate: 22 ng/mL (ref >5.9)

## 2018-04-03 LAB — SEDIMENTATION RATE: SED RATE: 6 mm/h (ref 0–15)

## 2018-04-03 LAB — TSH: TSH: 1.63 u[IU]/mL (ref 0.35–4.50)

## 2018-04-03 NOTE — Patient Instructions (Addendum)
NCS/EMG of the legs  Check labs  Continue gabapentin 300mg  daily  You can take flexeril as needed for low back pain  Continue your home exercises  Return to clinic in 6 months

## 2018-04-03 NOTE — Progress Notes (Signed)
  Follow-up Visit   Date: 04/03/18    Paul Holland MRN: [id number] DOB: 02/25/1971   Interim History: Paul Holland is a 47 y.o. right-handed Caucasian male with asthma returning to the clinic for follow-up of low back pain and feet tingling.  The patient was accompanied to the clinic by self.  History of present illness:  Starting around 2014, he developed low back pain, described as achy and localized over the midline. He develops tingling discomfort and sharp pain which starts in the back and radiates bilaterally down the lateral side of the leg, dorsum of the feet, and balls of the feet.   He started seeing a chiropractor who recommended low back exercises which helped his low back pain some, but did not alleviate any tingling.   Symptoms are worse sitting and improved by standing/walking.  Symptoms usually start with tingling and then evolves into sharp pain.  He has tried OTC NSAIDs which relieves some of his low back pain. He does not take any daily medications for his pain.  Of note, [place] and had significant soft issue injury as well as fractured his proximal lower leg.   UPDATE 04/03/2018:  He is here for follow-up visit for feet paresthesia and low back pain.  His MRI lumbar spine did not show nerve injury.  He went to physical therapy and appreciated marked benefit with low back pain with dry needling.  He takes gabapentin 300mg daily for his feet tingling, which has also improved. He no longer has tingling that travels down his legs.  He occasionally has cramps of the feet.  Overall, he has about 70-90% improvement with low back pain and feet tingling.   Medications:  Current Outpatient Medications on File Prior to Visit  Medication Sig Dispense Refill  . ADVAIR DISKUS 250-50 MCG/DOSE AEPB INHALE 1 DOSE BY MOUTH TWICE DAILY 60 each 5  . cyclobenzaprine (FLEXERIL) 5 MG tablet Take 1 tablet (5 mg total) by mouth at bedtime as needed for  muscle spasms. 30 tablet 5  . gabapentin (NEURONTIN) 300 MG capsule Take 1 tablet at bedtime for one week, then increase to 1 tablet twice daily 60 capsule 5  . loratadine (CLARITIN) 10 MG tablet Take 10-20 mg by mouth daily.    . Misc Natural Products (TUMERSAID) TABS Take by mouth.    . Multiple Vitamin (MULTI-VITAMINS) TABS Take 1 tablet by mouth daily.    . Omega-3 Fatty Acids (FISH OIL PO) Take by mouth.     No current facility-administered medications on file prior to visit.     Allergies: No Known Allergies  Review of Systems:  CONSTITUTIONAL: No fevers, chills, night sweats, or weight loss.  EYES: No visual changes or eye pain ENT: No hearing changes.  No history of nose bleeds.   RESPIRATORY: No cough, wheezing and shortness of breath.   CARDIOVASCULAR: Negative for chest pain, and palpitations.   GI: Negative for abdominal discomfort, blood in stools or black stools.  No recent change in bowel habits.   GU:  No history of incontinence.   MUSCLOSKELETAL: No history of joint pain or swelling.  No myalgias.   SKIN: Negative for lesions, rash, and itching.   ENDOCRINE: Negative for cold or heat intolerance, polydipsia or goiter.   PSYCH:  No depression or anxiety symptoms.   NEURO: As Above.   Vital Signs:  BP 104/80   Pulse 72   Ht 5' 11" (1.803 m)     Wt 242 lb 6 oz (109.9 kg)   SpO2 96%   BMI 33.80 kg/m    General Medical Exam:   General:  Well appearing, comfortable  Eyes/ENT: see cranial nerve examination.   Neck: No masses appreciated.  Full range of motion without tenderness.  No carotid bruits. Respiratory:  Clear to auscultation, good air entry bilaterally.   Cardiac:  Regular rate and rhythm, no murmur.   Ext:  No edema  Neurological Exam: MENTAL STATUS including orientation to time, place, person, recent and remote memory, attention span and concentration, language, and fund of knowledge is normal.  Speech is not dysarthric.  CRANIAL NERVES: No visual  field defects.  Pupils equal round and reactive to light.  Normal conjugate, extra-ocular eye movements in all directions of gaze.  No ptosis. Face is symmetric. Palate elevates symmetrically.  Tongue is midline.  MOTOR:  Motor strength is 5/5 in all extremities.   No pronator drift.  Tone is normal.    MSRs:  Reflexes are 2+/4 throughout.  SENSORY:  Intact to vibration, pin prick, and mildly reduced to temperature over the dorsum of the feet.  COORDINATION/GAIT:  Normal finger-to- nose-finger.  Gait narrow based and stable. 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 No herniated disc, spinal stenosis, or foraminal stenosis.  Lab Results  Component Value Date   [id number] 010 09/24/2017    IMPRESSION/PLAN: 1.  Bilateral feet paresthesia, unclear etiology.  No evidence of lumbosacral radiculopathy on MRI.  The next step is NCS/EMG of the legs to evaluate for neuropathy.  Check TSH, copper, vitamin B1, MMA, folate, ESR.  Continue gabapentin 353m at bedtime.  2.  Lumbar strain, improved with PT.  OK to use flexeril 587mat bedtime as needed for pain.  Continue home exercises.  Return to clinic in 6 months  Thank you for allowing me to participate in patient's care.  If I can answer any additional questions, I would be pleased to do so.    Sincerely,    Angle Karel K. PaPosey ProntoDO

## 2018-04-04 LAB — COPPER, SERUM: Copper: 103 ug/dL (ref 70–175)

## 2018-04-06 LAB — VITAMIN B1: Vitamin B1 (Thiamine): 9 nmol/L (ref 8–30)

## 2018-04-06 LAB — METHYLMALONIC ACID, SERUM: METHYLMALONIC ACID, QUANT: 123 nmol/L (ref 87–318)

## 2018-04-08 ENCOUNTER — Ambulatory Visit: Payer: BLUE CROSS/BLUE SHIELD | Admitting: Neurology

## 2018-04-08 ENCOUNTER — Telehealth: Payer: Self-pay | Admitting: *Deleted

## 2018-04-08 ENCOUNTER — Encounter: Payer: Self-pay | Admitting: *Deleted

## 2018-04-08 ENCOUNTER — Encounter: Payer: Self-pay | Admitting: Neurology

## 2018-04-08 NOTE — Telephone Encounter (Signed)
-----   Message from Morrow, DO sent at 04/08/2018  8:54 AM EDT ----- I have reviewed all lab results which are normal or stable. Please inform the patient.

## 2018-04-08 NOTE — Telephone Encounter (Signed)
-----   Message from Yale, DO sent at 04/08/2018  8:54 AM EDT ----- I have reviewed all lab results which are normal or stable. Please inform the patient.

## 2018-04-08 NOTE — Telephone Encounter (Signed)
Results sent via My Chart.  

## 2018-04-11 ENCOUNTER — Encounter: Payer: BLUE CROSS/BLUE SHIELD | Admitting: Neurology

## 2018-04-18 ENCOUNTER — Ambulatory Visit (INDEPENDENT_AMBULATORY_CARE_PROVIDER_SITE_OTHER): Payer: BLUE CROSS/BLUE SHIELD | Admitting: Neurology

## 2018-04-18 DIAGNOSIS — R202 Paresthesia of skin: Secondary | ICD-10-CM

## 2018-04-18 NOTE — Procedures (Signed)
[facility]  [place], [place]  [place],  [address] Tel: [phone] Fax:  [phone] Test Date:  04/18/2018  Patient: Paul Holland DOB: 1971/08/30 Physician: Narda Amber, DO  Sex: Male Height: 5\' 11"  Ref Phys: Narda Amber, DO  ID#: [id number]   Technician:    Patient Complaints: This is a 47 year-old man referred for evaluation of bilateral feet numbness and tingling.  NCV & EMG Findings: Extensive electrodiagnostic testing of the right lower extremity and additional studies of the left shows:  1. Bilateral sural and superficial peroneal sensory responses are within normal limits. 2. Bilateral tibial and peroneal motor responses are within normal limits. 3. Bilateral tibial H reflex studies are within normal limits. 4. There is no evidence of active or chronic motor axon loss changes affecting any of the tested muscles. Motor unit configuration and recruitment pattern is within normal limits.  Impression: This is a normal study of the lower extremities. In particular, there is no evidence of a large fiber sensorimotor polyneuropathy or lumbosacral radiculopathy.   ___________________________ Narda Amber, DO    Nerve Conduction Studies Anti Sensory Summary Table   Stim Site NR Peak (ms) Norm Peak (ms) P-T Amp (V) Norm P-T Amp  Left Sup Peroneal Anti Sensory (Ant Lat Mall)  12 cm    3.9 <4.5 6.2 >5  Right Sup Peroneal Anti Sensory (Ant Lat Mall)  12 cm    3.9 <4.5 7.7 >5  Left Sural Anti Sensory (Lat Mall)  Calf    3.4 <4.5 7.3 >5  Right Sural Anti Sensory (Lat Mall)  Calf    3.7 <4.5 7.3 >5   Motor Summary Table   Stim Site NR Onset (ms) Norm Onset (ms) O-P Amp (mV) Norm O-P Amp Site1 Site2 Delta-0 (ms) Dist (cm) Vel (m/s) Norm Vel (m/s)  Left Peroneal Motor (Ext Dig Brev)  Ankle    3.7 <5.5 6.4 >3 B Fib Ankle 8.6 35.0 41 >40  B Fib    12.3  6.4  Poplt B Fib 1.5 8.0 53 >40  Poplt    13.8  6.3         Right Peroneal Motor (Ext Dig  Brev)  Ankle    3.6 <5.5 6.3 >3 B Fib Ankle 9.2 39.0 42 >40  B Fib    12.8  6.1  Poplt B Fib 2.0 9.0 45 >40  Poplt    14.8  5.9         Left Tibial Motor (Abd Hall Brev)  Ankle    3.5 <6.0 8.4 >8 Knee Ankle 10.0 40.0 40 >40  Knee    13.5  5.0         Right Tibial Motor (Abd Hall Brev)  Ankle    3.8 <6.0 8.5 >8 Knee Ankle 10.0 42.0 42 >40  Knee    13.8  5.9          H Reflex Studies   NR H-Lat (ms) Lat Norm (ms) L-R H-Lat (ms)  Left Tibial (Gastroc)     34.15 <35 0.00  Right Tibial (Gastroc)     34.15 <35 0.00   EMG   Side Muscle Ins Act Fibs Psw Fasc Number Recrt Dur Dur. Amp Amp. Poly Poly. Comment  Left AntTibialis Nml Nml Nml Nml Nml Nml Nml Nml Nml Nml Nml Nml N/A  Left Gastroc Nml Nml Nml Nml Nml Nml Nml Nml Nml Nml Nml Nml N/A  Left Flex Dig Long Nml Nml Nml Nml Nml  Nml Nml Nml Nml Nml Nml Nml N/A  Left RectFemoris Nml Nml Nml Nml Nml Nml Nml Nml Nml Nml Nml Nml N/A  Left GluteusMed Nml Nml Nml Nml Nml Nml Nml Nml Nml Nml Nml Nml N/A  Right AntTibialis Nml Nml Nml Nml Nml Nml Nml Nml Nml Nml Nml Nml N/A  Right Gastroc Nml Nml Nml Nml Nml Nml Nml Nml Nml Nml Nml Nml N/A  Right Flex Dig Long Nml Nml Nml Nml Nml Nml Nml Nml Nml Nml Nml Nml N/A  Right GluteusMed Nml Nml Nml Nml Nml Nml Nml Nml Nml Nml Nml Nml N/A  Right RectFemoris Nml Nml Nml Nml Nml Nml Nml Nml Nml Nml Nml Nml N/A      Waveforms:

## 2018-04-19 ENCOUNTER — Telehealth: Payer: Self-pay | Admitting: *Deleted

## 2018-04-19 ENCOUNTER — Encounter: Payer: Self-pay | Admitting: Neurology

## 2018-04-19 NOTE — Telephone Encounter (Signed)
Left message giving patient the results.  Informed him that I will mail him an appointment card.

## 2018-04-19 NOTE — Telephone Encounter (Signed)
-----   Message from Alda Berthold, DO sent at 04/19/2018  8:02 AM EDT ----- Please inform patient that his nerve testing looks normal - so signs of nerve injury.  For now, continue gabapentin, if symptoms get worse we may need to recheck it. He will need f/u in 3 months.

## 2018-05-13 ENCOUNTER — Encounter: Payer: Self-pay | Admitting: Internal Medicine

## 2018-05-24 ENCOUNTER — Ambulatory Visit: Payer: BLUE CROSS/BLUE SHIELD | Admitting: Internal Medicine

## 2018-05-24 ENCOUNTER — Encounter: Payer: Self-pay | Admitting: Internal Medicine

## 2018-05-24 VITALS — BP 102/70 | HR 72 | Temp 98.2°F | Ht 71.0 in | Wt 245.0 lb

## 2018-05-24 DIAGNOSIS — G629 Polyneuropathy, unspecified: Secondary | ICD-10-CM | POA: Insufficient documentation

## 2018-05-24 NOTE — Assessment & Plan Note (Signed)
Reassured---no vascular problems Gabapentin does help pain--but not the sensation Discussed continuing the B vitamins

## 2018-05-24 NOTE — Progress Notes (Signed)
Subjective:    Patient ID: Paul Holland, male    DOB: [birth year], 47 y.o.   MRN: [id number]  HPI Here due to concerns about his neuropathy and circulation  Ongoing pain Wonders if his cold feet indicate circulation issues Doesn't endorse Raynaud's  Dermatologist just mentioned feet cold to touch He doesn't feel them as cold Often they are warm he feels  Will walk up to 2 miles 3 times a week Rides stationery bike 8 miles No foot or leg pain associated with these activities  Current Outpatient Medications on File Prior to Visit  Medication Sig Dispense Refill  . ADVAIR DISKUS 250-50 MCG/DOSE AEPB INHALE 1 DOSE BY MOUTH TWICE DAILY 60 each 5  . cyclobenzaprine (FLEXERIL) 5 MG tablet Take 1 tablet (5 mg total) by mouth at bedtime as needed for muscle spasms. 30 tablet 5  . gabapentin (NEURONTIN) 300 MG capsule Take 1 tablet at bedtime for one week, then increase to 1 tablet twice daily 60 capsule 5  . loratadine (CLARITIN) 10 MG tablet Take 10-20 mg by mouth daily.    . Misc Natural Products (TUMERSAID) TABS Take by mouth.    . Multiple Vitamin (MULTI-VITAMINS) TABS Take 1 tablet by mouth daily.    . Omega-3 Fatty Acids (FISH OIL PO) Take by mouth.     No current facility-administered medications on file prior to visit.     No Known Allergies  Past Medical History:  Diagnosis Date  . Allergic rhinitis due to pollen   . 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 Heart murmur    probably mild MR. Sees Dr Nehemiah Massed  . Osteoarthritis of wrist     Past Surgical History:  Procedure Laterality Date  . Cartilage removed from ribs  1981  . KNEE ARTHROSCOPY WITH ANTERIOR CRUCIATE LIGAMENT (ACL) REPAIR  6/14 & 8/14   meniscus repair and tibial fracture repair (bone graft). ACL not repaired    Family History  Problem Relation Age of Onset  . Hypertension Mother   . Hypertension Father   . Diabetes Brother   . Hyperlipidemia Brother   . Hypertension Brother    . Cancer Paternal Uncle        colon  . Cancer Paternal Grandfather   . Heart disease Neg Hx     Social History   Socioeconomic History  . Marital status: Married    Spouse name: Not on file  . Number of children: 3  . Years of education: Not on file  . Highest education level: Not on file  Occupational History  . 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 Financial resource strain: Not on file  . Food insecurity:    Worry: Not on file    Inability: Not on file  . Transportation needs:    Medical: Not on file    Non-medical: Not on file  Tobacco Use  . Smoking status: Former Research scientist (life sciences)  . Smokeless tobacco: Never Used  Substance and Sexual Activity  . Alcohol use: Yes    Comment: Occasionally - sporadic  . Drug use: No  . Sexual activity: Not on file  Lifestyle  . Physical activity:    Days per week: Not on file    Minutes per session: Not on file  . Stress: Not on file  Relationships  . Social connections:    Talks on phone: Not on file    Gets together: Not on file  Attends religious service: Not on file    Active member of club or organization: Not on file    Attends meetings of clubs or organizations: Not on file    Relationship status: Not on file  . Intimate partner violence:    Fear of current or ex partner: Not on file    Emotionally abused: Not on file    Physically abused: Not on file    Forced sexual activity: Not on file  Other Topics Concern  . Not on file  Social History Narrative   2nd marriage   3 children who live with mother in [place]----regular visits to them.   Lives in a 2 story home.     Education: some college.  Works as a Librarian, academic for a [employer]        Review of Systems  Still working with PT---gets dry needled in sacral area Lumbar spine now flexing better Sleeps well     Objective:   Physical Exam  Constitutional: He appears well-developed. No distress.  Cardiovascular:    Completely normal DP and PT pulses bilaterally  Skin:  Slightly reduced capillary refill in feet Not cold though No lesions           Assessment & Plan:

## 2018-05-28 ENCOUNTER — Encounter: Payer: Self-pay | Admitting: Internal Medicine

## 2018-06-18 ENCOUNTER — Encounter: Payer: Self-pay | Admitting: Internal Medicine

## 2018-06-18 ENCOUNTER — Ambulatory Visit (INDEPENDENT_AMBULATORY_CARE_PROVIDER_SITE_OTHER): Payer: BLUE CROSS/BLUE SHIELD | Admitting: Internal Medicine

## 2018-06-18 VITALS — BP 120/82 | HR 70 | Temp 97.6°F | Ht 70.5 in | Wt 247.0 lb

## 2018-06-18 DIAGNOSIS — J683 Other acute and subacute respiratory conditions due to chemicals, gases, fumes and vapors: Secondary | ICD-10-CM

## 2018-06-18 DIAGNOSIS — G629 Polyneuropathy, unspecified: Secondary | ICD-10-CM

## 2018-06-18 DIAGNOSIS — Z0000 Encounter for general adult medical examination without abnormal findings: Secondary | ICD-10-CM

## 2018-06-18 LAB — COMPREHENSIVE METABOLIC PANEL
ALK PHOS: 63 U/L (ref 39–117)
ALT: 37 U/L (ref 0–53)
AST: 30 U/L (ref 0–37)
Albumin: 4.4 g/dL (ref 3.5–5.2)
BILIRUBIN TOTAL: 0.7 mg/dL (ref 0.2–1.2)
BUN: 16 mg/dL (ref 6–23)
CO2: 29 mEq/L (ref 19–32)
Calcium: 9 mg/dL (ref 8.4–10.5)
Chloride: 102 mEq/L (ref 96–112)
Creatinine, Ser: 1.05 mg/dL (ref 0.40–1.50)
GFR: 80.23 mL/min (ref >60.00)
GLUCOSE: 96 mg/dL (ref 70–99)
POTASSIUM: 3.8 meq/L (ref 3.5–5.1)
Sodium: 138 mEq/L (ref 135–145)
TOTAL PROTEIN: 7.4 g/dL (ref 6.0–8.3)

## 2018-06-18 LAB — LIPID PANEL
CHOL/HDL RATIO: 4
Cholesterol: 187 mg/dL (ref 0–200)
HDL: 43.7 mg/dL (ref >39.00)
LDL Cholesterol: 117 mg/dL — ABNORMAL HIGH (ref 0–99)
NonHDL: 143.27
Triglycerides: 131 mg/dL (ref 0.0–149.0)
VLDL: 26.2 mg/dL (ref 0.0–40.0)

## 2018-06-18 LAB — CBC
HEMATOCRIT: 46.1 % (ref 39.0–52.0)
Hemoglobin: 16.2 g/dL (ref 13.0–17.0)
MCHC: 35.1 g/dL (ref 30.0–36.0)
MCV: 94.2 fl (ref 78.0–100.0)
Platelets: 241 10*3/uL (ref 150.0–400.0)
RBC: 4.89 Mil/uL (ref 4.22–5.81)
RDW: 13.3 % (ref 11.5–15.5)
WBC: 7.1 10*3/uL (ref 4.0–10.5)

## 2018-06-18 NOTE — Progress Notes (Signed)
Subjective:    Patient ID: Paul Holland, male    DOB: [birth year], 47 y.o.   MRN: [id number]  HPI Here for physical  No new concerns Same tingling in feet Sensation of stiffness in toes---all the time Wears steel toed boots--but thinks they fit well  Asthma has been quiet Minor nasal symptoms Continues on the advair  Current Outpatient Medications on File Prior to Visit  Medication Sig Dispense Refill  . ADVAIR DISKUS 250-50 MCG/DOSE AEPB INHALE 1 DOSE BY MOUTH TWICE DAILY 60 each 5  . cyclobenzaprine (FLEXERIL) 5 MG tablet Take 1 tablet (5 mg total) by mouth at bedtime as needed for muscle spasms. 30 tablet 5  . gabapentin (NEURONTIN) 300 MG capsule Take 1 tablet at bedtime for one week, then increase to 1 tablet twice daily 60 capsule 5  . loratadine (CLARITIN) 10 MG tablet Take 10-20 mg by mouth daily.    . Misc Natural Products (TUMERSAID) TABS Take by mouth.    . Multiple Vitamin (MULTI-VITAMINS) TABS Take 1 tablet by mouth daily.    . Omega-3 Fatty Acids (FISH OIL PO) Take by mouth.     No current facility-administered medications on file prior to visit.     No Known Allergies  Past Medical History:  Diagnosis Date  . Allergic rhinitis due to pollen   . 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 Heart murmur    probably mild MR. Sees Dr Nehemiah Massed  . Osteoarthritis of wrist     Past Surgical History:  Procedure Laterality Date  . Cartilage removed from ribs  1981  . KNEE ARTHROSCOPY WITH ANTERIOR CRUCIATE LIGAMENT (ACL) REPAIR  6/14 & 8/14   meniscus repair and tibial fracture repair (bone graft). ACL not repaired    Family History  Problem Relation Age of Onset  . Hypertension Mother   . Hypertension Father   . Diabetes Brother   . Hyperlipidemia Brother   . Hypertension Brother   . Cancer Paternal Uncle        colon  . Cancer Paternal Grandfather   . Heart disease Neg Hx     Social History   Socioeconomic History  . Marital  status: Married    Spouse name: Not on file  . Number of children: 3  . Years of education: Not on file  . Highest education level: Not on file  Occupational History  . 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 Financial resource strain: Not on file  . Food insecurity:    Worry: Not on file    Inability: Not on file  . Transportation needs:    Medical: Not on file    Non-medical: Not on file  Tobacco Use  . Smoking status: Former Research scientist (life sciences)  . Smokeless tobacco: Never Used  Substance and Sexual Activity  . Alcohol use: Yes    Comment: Occasionally - sporadic  . Drug use: No  . Sexual activity: Not on file  Lifestyle  . Physical activity:    Days per week: Not on file    Minutes per session: Not on file  . Stress: Not on file  Relationships  . Social connections:    Talks on phone: Not on file    Gets together: Not on file    Attends religious service: Not on file    Active member of club or organization: Not on file    Attends meetings of clubs or organizations:  Not on file    Relationship status: Not on file  . Intimate partner violence:    Fear of current or ex partner: Not on file    Emotionally abused: Not on file    Physically abused: Not on file    Forced sexual activity: Not on file  Other Topics Concern  . Not on file  Social History Narrative   2nd marriage   3 children who live with mother in [place]----regular visits to them.   Lives in a 2 story home.     Education: some college.  Works as a Librarian, academic for a [employer]        Review of Systems  Constitutional:       Walks regularly Stationery bike at times Weight stable--inconsistent with eating when work is crazy Wears seat belt  HENT: Negative for hearing loss, tinnitus and trouble swallowing.        Keeps up with dentist Some gum recession  Eyes: Negative for visual disturbance.       No diplopia or unilateral vision loss   Respiratory: Negative for  cough, chest tightness, shortness of breath and wheezing.   Cardiovascular: Negative for chest pain, palpitations and leg swelling.  Gastrointestinal: Negative for abdominal pain, blood in stool and constipation.       Rare heartburn--- tums  Endocrine: Negative for polydipsia and polyuria.  Genitourinary: Negative for difficulty urinating and urgency.       No sexual problems  Musculoskeletal: Negative for joint swelling.       Low back pain is chronic----gets dry needling ~monthly (by [facility]) Chronic right knee pain---rare ibuprofen  Skin: Negative for rash.       Sees derm yearly  Allergic/Immunologic: Positive for environmental allergies. Negative for immunocompromised state.       Uses claritin year round  Neurological: Negative for dizziness, syncope, light-headedness and headaches.  Hematological: Negative for adenopathy. Does not bruise/bleed easily.  Psychiatric/Behavioral: Negative for dysphoric mood and sleep disturbance. The patient is not nervous/anxious.        Objective:   Physical Exam  Constitutional: He is oriented to person, place, and time. He appears well-developed. No distress.  HENT:  Head: Normocephalic and atraumatic.  Right Ear: External ear normal.  Left Ear: External ear normal.  Mouth/Throat: Oropharynx is clear and moist. No oropharyngeal exudate.  Eyes: Pupils are equal, round, and reactive to light. Conjunctivae are normal.  Neck: No thyromegaly present.  Cardiovascular: Normal rate, regular rhythm, normal heart sounds and intact distal pulses. Exam reveals no gallop.  No murmur heard. Respiratory: Effort normal and breath sounds normal. No respiratory distress. He has no wheezes. He has no rales.  GI: Soft. There is no tenderness.  Musculoskeletal: He exhibits no edema or tenderness.  Lymphadenopathy:    He has no cervical adenopathy.  Neurological: He is alert and oriented to person, place, and time.  Skin: No rash noted. No erythema.  Many  benign nevi  Psychiatric: He has a normal mood and affect. His behavior is normal.           Assessment & Plan:

## 2018-06-18 NOTE — Assessment & Plan Note (Signed)
Doing well on the advair

## 2018-06-18 NOTE — Assessment & Plan Note (Signed)
No sig pain on the gabapentin

## 2018-06-18 NOTE — Assessment & Plan Note (Signed)
Healthy Discussed fitness Colon at age 47 (dad has polyps) Yearly flu vaccine

## 2018-07-01 ENCOUNTER — Encounter: Payer: Self-pay | Admitting: Internal Medicine

## 2018-07-04 ENCOUNTER — Other Ambulatory Visit: Payer: Self-pay | Admitting: Neurology

## 2018-07-15 ENCOUNTER — Encounter: Payer: BLUE CROSS/BLUE SHIELD | Admitting: Internal Medicine

## 2018-09-23 ENCOUNTER — Encounter

## 2018-09-23 ENCOUNTER — Ambulatory Visit: Payer: BLUE CROSS/BLUE SHIELD | Admitting: Neurology

## 2018-09-23 ENCOUNTER — Encounter: Payer: Self-pay | Admitting: Neurology

## 2018-09-23 VITALS — BP 110/80 | HR 73 | Ht 70.5 in | Wt 249.0 lb

## 2018-09-23 DIAGNOSIS — G629 Polyneuropathy, unspecified: Secondary | ICD-10-CM

## 2018-09-23 NOTE — Patient Instructions (Signed)
Return to clinic in 6 months.

## 2018-09-23 NOTE — Progress Notes (Signed)
Follow-up Visit   Date: 09/23/18    Khizar Fiorella MRN: [id number] DOB: 1971-11-28   Interim History: Ryett Hamman is a 47 y.o. right-handed Caucasian male with asthma returning to the clinic for follow-up of low back pain and feet tingling.  The patient was accompanied to the clinic by self.  History of present illness: Starting around 2014, he developed low back pain, described as achy and localized over the midline. He develops tingling discomfort and sharp pain which starts in the back and radiates bilaterally down the lateral side of the leg, dorsum of the feet, and balls of the feet.   He started seeing a chiropractor who recommended low back exercises which helped his low back pain some, but did not alleviate any tingling.   Symptoms are worse sitting and improved by standing/walking.  Symptoms usually start with tingling and then evolves into sharp pain.  He has tried OTC NSAIDs which relieves some of his low back pain. He does not take any daily medications for his pain.  Of note, [place] and had significant soft issue injury as well as fractured his proximal lower leg.   UPDATE 04/03/2018:   His MRI lumbar spine shows no nerve impingement.  He went to physical therapy and appreciated marked benefit with low back pain with dry needling.  He takes gabapentin 331m daily for his feet tingling, which has also improved. He no longer has tingling that travels down his legs.  He occasionally has cramps of the feet.  Overall, he has about 70-90% improvement with low back pain and feet tingling.   UPDATE 09/23/2018:  He is here for follow-up visit.  He continues to have tingling of the feet, which is well controlled on gabapentin 3077mtwice daily.  His NCS/EMG of the legs and neuropathy labs returned normal.  He continues to get dry needling about once per month which helps with low back pain.  He does not have any new complaints.    Medications:    Current Outpatient Medications on File Prior to Visit  Medication Sig Dispense Refill  . ADVAIR DISKUS 250-50 MCG/DOSE AEPB INHALE 1 DOSE BY MOUTH TWICE DAILY 60 each 5  . cyclobenzaprine (FLEXERIL) 5 MG tablet TAKE 1 TABLET BY MOUTH AT BEDTIME AS NEEDED FOR MUSCLE SPASM 30 tablet 5  . gabapentin (NEURONTIN) 300 MG capsule TAKE 1 CAPSULE BY MOUTH TWICE DAILY 60 capsule 5  . loratadine (CLARITIN) 10 MG tablet Take 10-20 mg by mouth daily.    . Misc Natural Products (TUMERSAID) TABS Take by mouth.    . Multiple Vitamin (MULTI-VITAMINS) TABS Take 1 tablet by mouth daily.    . Omega-3 Fatty Acids (FISH OIL PO) Take by mouth.     No current facility-administered medications on file prior to visit.     Allergies: No Known Allergies  Review of Systems:  CONSTITUTIONAL: No fevers, chills, night sweats, or weight loss.  EYES: No visual changes or eye pain ENT: No hearing changes.  No history of nose bleeds.   RESPIRATORY: No cough, wheezing and shortness of breath.   CARDIOVASCULAR: Negative for chest pain, and palpitations.   GI: Negative for abdominal discomfort, blood in stools or black stools.  No recent change in bowel habits.   GU:  No history of incontinence.   MUSCLOSKELETAL: No history of joint pain or swelling.  No myalgias.   SKIN: Negative for lesions, rash, and itching.   ENDOCRINE:  Negative for cold or heat intolerance, polydipsia or goiter.   PSYCH:  No depression or anxiety symptoms.   NEURO: As Above.   Vital Signs:  BP 110/80   Pulse 73   Ht 5' 10.5" (1.791 m)   Wt 249 lb (112.9 kg)   SpO2 95%   BMI 35.22 kg/m    General Medical Exam:   General:  Well appearing, comfortable  Eyes/ENT: see cranial nerve examination.   Neck: No masses appreciated.  Full range of motion without tenderness.  No carotid bruits. Respiratory:  Clear to auscultation, good air entry bilaterally.   Cardiac:  Regular rate and rhythm, no murmur.   Ext:  No edema  Neurological  Exam: MENTAL STATUS including orientation to time, place, person, recent and remote memory, attention span and concentration, language, and fund of knowledge is normal.  Speech is not dysarthric.  CRANIAL NERVES: No visual field defects.  Pupils equal round and reactive to light.  Normal conjugate, extra-ocular eye movements in all directions of gaze.  No ptosis. Face is symmetric. Palate elevates symmetrically.  Tongue is midline.  MOTOR:  Motor strength is 5/5 in all extremities.   No pronator drift.  Tone is normal.    MSRs:  Reflexes are 2+/4 throughout.  SENSORY:  Intact to vibration, pin prick, and mildly reduced to temperature over the dorsum of the feet.  COORDINATION/GAIT:  Normal finger-to- nose-finger.  Gait narrow based and stable. 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 No herniated disc, spinal stenosis, or foraminal stenosis. NCS/EMG of the legs 04/08/2018:  Normal  Labs 04/03/2018:  TSH 1.63, copper 103, vitamin 9, MMA 123, folate 22, ESR 6   IMPRESSION/PLAN: 1.  Bilateral feet paresthesia, unclear etiology - probable early and distal neuropathy given his response to gabapentin.  No evidence of lumbosacral radiculopathy on MRI.  Normal NCS/EMG of the legs makes large fiber neuropathy unlikely.  Neuropathy labs are normal.  Skin biopsy for small fiber neuropathy is the next step, if symptoms progress.  Continue gabapentin 341m at bedtime.    2.  Lumbar strain, improved with PT.  OK to use flexeril 51mat bedtime as needed for pain.  Continue home exercises.  Return to clinic in 6 months  Thank you for allowing me to participate in patient's care.  If I can answer any additional questions, I would be pleased to do so.    Sincerely,    Trellis Vanoverbeke K. PaPosey ProntoDO

## 2018-09-24 ENCOUNTER — Telehealth: Payer: Self-pay | Admitting: *Deleted

## 2018-09-24 ENCOUNTER — Other Ambulatory Visit: Payer: Self-pay | Admitting: *Deleted

## 2018-09-24 DIAGNOSIS — R202 Paresthesia of skin: Secondary | ICD-10-CM

## 2018-09-24 NOTE — Telephone Encounter (Signed)
-----   Message from Alda Berthold, DO sent at 09/24/2018 11:09 AM EDT ----- Regarding: OK to refer to podiatry Please make referral to podiatry for bilateral feet pain. Thanks

## 2018-09-24 NOTE — Telephone Encounter (Signed)
Referral sent to [facility].

## 2018-10-03 ENCOUNTER — Ambulatory Visit: Payer: BLUE CROSS/BLUE SHIELD

## 2018-10-17 ENCOUNTER — Ambulatory Visit (INDEPENDENT_AMBULATORY_CARE_PROVIDER_SITE_OTHER): Payer: BLUE CROSS/BLUE SHIELD

## 2018-10-17 DIAGNOSIS — Z23 Encounter for immunization: Secondary | ICD-10-CM | POA: Diagnosis not present

## 2018-10-18 ENCOUNTER — Ambulatory Visit: Payer: BLUE CROSS/BLUE SHIELD | Admitting: Podiatry

## 2018-10-18 ENCOUNTER — Other Ambulatory Visit: Payer: Self-pay | Admitting: Podiatry

## 2018-10-18 ENCOUNTER — Ambulatory Visit (INDEPENDENT_AMBULATORY_CARE_PROVIDER_SITE_OTHER): Payer: BLUE CROSS/BLUE SHIELD

## 2018-10-18 ENCOUNTER — Encounter: Payer: Self-pay | Admitting: Podiatry

## 2018-10-18 DIAGNOSIS — G609 Hereditary and idiopathic neuropathy, unspecified: Secondary | ICD-10-CM

## 2018-10-18 DIAGNOSIS — M79671 Pain in right foot: Secondary | ICD-10-CM

## 2018-10-18 DIAGNOSIS — M79672 Pain in left foot: Principal | ICD-10-CM

## 2018-10-21 NOTE — Progress Notes (Signed)
   HPI: 47 year old male presenting today with a chief complaint of numbness and stiffness noted to the plantar aspect of bilateral feet starting at the forefoot to the arch that began 1-2 years ago. He reports associated cramping in the left foot that radiates into the ankle. Standing for long periods of time increases the pain. He has been taking Gabapentin for his symptoms. He is currently being treated by Dr. Posey Pronto, Neurology, with normal nerve conduction studies. Patient is here for further evaluation and treatment.   Past Medical History:  Diagnosis Date  . Allergic rhinitis due to pollen   . 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 Heart murmur    probably mild MR. Sees Dr Nehemiah Massed  . Osteoarthritis of wrist      Physical Exam: General: The patient is alert and oriented x3 in no acute distress.  Dermatology: Skin is warm, dry and supple bilateral lower extremities. Negative for open lesions or macerations.  Vascular: Palpable pedal pulses bilaterally. No edema or erythema noted. Capillary refill within normal limits.  Neurological: Epicritic and protective threshold diminished bilaterally.   Musculoskeletal Exam: Range of motion within normal limits to all pedal and ankle joints bilateral. Muscle strength 5/5 in all groups bilateral.   Radiographic Exam:  Normal osseous mineralization. Joint spaces preserved. No fracture/dislocation/boney destruction.    Assessment: 1. Idiopathic peripheral neuropathy bilateral    Plan of Care:  1. Patient evaluated. X-Rays reviewed.  2. Discussed small fiber nerve biopsy as a further testing modality.  3. Orders for physical therapy with [employer] treatment placed.  4. Continue taking Gabapentin 300 mg twice daily as directed by Dr. Posey Pronto, Neurology.  5. Return to clinic as needed.       Edrick Kins, DPM [facility]  Dr. Edrick Kins, DPM    [address], [place] [address]                Office [phone]  Fax [phone]

## 2019-01-09 NOTE — Telephone Encounter (Signed)
Go ahead and send 1 year Rx for the 230/21 for him Let him know when it is done

## 2019-01-10 MED ORDER — FLUTICASONE-SALMETEROL 230-21 MCG/ACT IN AERO: 2.0000 | INHALATION_SPRAY | Freq: Two times a day (BID) | RESPIRATORY_TRACT | 12 refills | Status: DC

## 2019-01-13 ENCOUNTER — Other Ambulatory Visit: Payer: Self-pay | Admitting: Neurology

## 2019-01-16 MED ORDER — FLUTICASONE-SALMETEROL 250-50 MCG/DOSE IN AEPB: 1.0000 | INHALATION_SPRAY | Freq: Two times a day (BID) | RESPIRATORY_TRACT | 11 refills | Status: DC

## 2019-01-16 NOTE — Addendum Note (Signed)
Addended by: Pilar Grammes on: 01/16/2019 03:52 PM   Modules accepted: Orders

## 2019-02-19 ENCOUNTER — Other Ambulatory Visit: Payer: Self-pay | Admitting: *Deleted

## 2019-02-19 MED ORDER — CYCLOBENZAPRINE HCL 5 MG PO TABS: ORAL_TABLET | ORAL | 5 refills | Status: DC

## 2019-03-07 NOTE — Progress Notes (Signed)
Virtual Visit via Video Note The purpose of this virtual visit is to provide medical care while limiting exposure to the novel coronavirus.    Consent was obtained for video visit:  Yes.   Answered questions that patient had about telehealth interaction:  Yes.   I discussed the limitations, risks, security and privacy concerns of performing an evaluation and management service by telemedicine. I also discussed with the patient that there may be a patient responsible charge related to this service. The patient expressed understanding and agreed to proceed.  Pt location: Home Physician Location: office Name of referring provider:  Venia Carbon, MD I connected with Paul Holland at patients initiation/request on 03/10/2019 at  9:00 AM EDT by video enabled telemedicine application and verified that I am speaking with the correct person using two identifiers. Pt MRN:  [id number] Pt DOB:  25-Jun-1971 Video Participants:  Paul Holland   History of Present Illness: This is a 48 y.o. male returning for follow-up of bilateral feet paresthesias and low back pain. He completed physical therapy which has helped some of his feet paresthesias and low back pain.  He continues to get dry needling about every 6 weeks for low back pain.  He takes flexeril 5mg  and gabapentin 300mg  twice daily which provides relief.  No new complaints.    Past Medical History:  Diagnosis Date  . Allergic rhinitis due to pollen   . 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 Heart murmur    probably mild MR. Sees Dr Nehemiah Massed  . Osteoarthritis of wrist     Current Outpatient Medications on File Prior to Visit  Medication Sig Dispense Refill  . cyclobenzaprine (FLEXERIL) 5 MG tablet TAKE 1 TABLET BY MOUTH AT BEDTIME AS NEEDED FOR MUSCLE SPASM 30 tablet 5  . Fluticasone-Salmeterol (ADVAIR) 250-50 MCG/DOSE AEPB Inhale 1 puff into the lungs 2 (two) times daily. 60 each 11  . gabapentin (NEURONTIN)  300 MG capsule TAKE 1 CAPSULE BY MOUTH TWICE DAILY 60 capsule 5  . loratadine (CLARITIN) 10 MG tablet Take 10-20 mg by mouth daily.    . Misc Natural Products (TUMERSAID) TABS Take by mouth.    . Multiple Vitamin (MULTI-VITAMINS) TABS Take 1 tablet by mouth daily.    . Omega-3 Fatty Acids (FISH OIL PO) Take by mouth.     No current facility-administered medications on file prior to visit.      Review of Systems:  CONSTITUTIONAL: No fevers, chills, night sweats, or weight loss.  EYES: No visual changes or eye pain ENT: No hearing changes.  No history of nose bleeds.   RESPIRATORY: No cough, wheezing and shortness of breath.   CARDIOVASCULAR: Negative for chest pain, and palpitations.   GI: Negative for abdominal discomfort, blood in stools or black stools.  No recent change in bowel habits.   GU:  No history of incontinence.   MUSCLOSKELETAL: No history of joint pain or swelling.  No myalgias.   SKIN: Negative for lesions, rash, and itching.   ENDOCRINE: Negative for cold or heat intolerance, polydipsia or goiter.   PSYCH:  No depression or anxiety symptoms.   NEURO: As Above.  Observations/Objective:   Patient is awake, alert, and appears comfortable.   Extraocular muscles are intact. No ptosis.  Face is symmetric.  Speech is not dysarthric.  Antigravity in all extremities.  No pronator drift. Rhomberg testing is negative. Gait appears normal.  He is able to stand on each  leg..   Assessment and Plan:  1.  Bilateral feet paresthesias, possibly early neuropathy given response to gabapentin.  NCS/EMG and MRI lumbar spine is unremarkable.    - Continue gabapentin 300mg  at bedtime  - Consider skin biopsy if symptoms get worse.  2.  Lumbar strain, improved with PT.   - OK to use flexeril 5mg  at bedtime as needed   Follow Up Instructions:   I discussed the assessment and treatment plan with the patient. The patient was provided an opportunity to ask questions and all were  answered. The patient agreed with the plan and demonstrated an understanding of the instructions.   The patient was advised to call back or seek an in-person evaluation if the symptoms worsen or if the condition fails to improve as anticipated.  Follow-up in 6 months   Alda Berthold, DO

## 2019-03-10 ENCOUNTER — Encounter: Payer: Self-pay | Admitting: *Deleted

## 2019-03-10 ENCOUNTER — Other Ambulatory Visit: Payer: Self-pay

## 2019-03-10 ENCOUNTER — Telehealth (INDEPENDENT_AMBULATORY_CARE_PROVIDER_SITE_OTHER): Payer: Managed Care, Other (non HMO) | Admitting: Neurology

## 2019-03-10 DIAGNOSIS — G8929 Other chronic pain: Secondary | ICD-10-CM

## 2019-03-10 DIAGNOSIS — R202 Paresthesia of skin: Secondary | ICD-10-CM | POA: Diagnosis not present

## 2019-03-10 DIAGNOSIS — M545 Low back pain: Secondary | ICD-10-CM

## 2019-03-31 ENCOUNTER — Ambulatory Visit: Payer: BLUE CROSS/BLUE SHIELD | Admitting: Neurology

## 2019-06-27 ENCOUNTER — Ambulatory Visit: Payer: Managed Care, Other (non HMO) | Admitting: Podiatry

## 2019-06-27 ENCOUNTER — Encounter: Payer: Self-pay | Admitting: Podiatry

## 2019-06-27 ENCOUNTER — Other Ambulatory Visit: Payer: Self-pay

## 2019-06-27 VITALS — Temp 98.2°F

## 2019-06-27 DIAGNOSIS — L84 Corns and callosities: Secondary | ICD-10-CM

## 2019-06-27 DIAGNOSIS — G609 Hereditary and idiopathic neuropathy, unspecified: Secondary | ICD-10-CM | POA: Diagnosis not present

## 2019-06-30 ENCOUNTER — Encounter: Payer: BLUE CROSS/BLUE SHIELD | Admitting: Internal Medicine

## 2019-07-04 NOTE — Progress Notes (Signed)
   HPI: 48 year old male presenting today for follow up evaluation of a painful callus lesion noted to the medial aspect of the right hallux that appeared three weeks ago. He states the pain is intermittent and rubs against different shoes making the pain worse. He has not done anything for treatment. Patient is here for further evaluation and treatment.   Past Medical History:  Diagnosis Date  . Allergic rhinitis due to pollen   . 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 Heart murmur    probably mild MR. Sees Dr Nehemiah Massed  . Osteoarthritis of wrist      Physical Exam: General: The patient is alert and oriented x3 in no acute distress.  Dermatology: Hyperkeratotic lesion present on the medial right great toe. Pain on palpation with a central nucleated core noted. Skin is warm, dry and supple bilateral lower extremities. Negative for open lesions or macerations.  Vascular: Palpable pedal pulses bilaterally. No edema or erythema noted. Capillary refill within normal limits.  Neurological: Epicritic and protective threshold diminished bilaterally.   Musculoskeletal Exam: Range of motion within normal limits to all pedal and ankle joints bilateral. Muscle strength 5/5 in all groups bilateral.   Assessment: 1. Idiopathic peripheral neuropathy bilateral  2. Callus medial right great toe   Plan of Care:  1. Patient evaluated.  2. Continue taking Gabapentin 300 mg QHS as directed by Dr. Posey Pronto, neurologist.  3. Excisional debridement of keratotic lesion using a chisel blade was performed without incident. Light dressing applied.  4. Recommended good shoe gear.  5. Return to clinic as needed.   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        Edrick Kins, DPM [facility]  Dr. Edrick Kins, DPM    [address], [place] [address]                Office [phone]  Fax [phone]

## 2019-07-07 ENCOUNTER — Encounter: Payer: BLUE CROSS/BLUE SHIELD | Admitting: Internal Medicine

## 2019-08-12 ENCOUNTER — Ambulatory Visit: Payer: Managed Care, Other (non HMO) | Admitting: Internal Medicine

## 2019-09-01 ENCOUNTER — Encounter: Payer: Self-pay | Admitting: Neurology

## 2019-09-03 ENCOUNTER — Telehealth: Payer: Self-pay | Admitting: Neurology

## 2019-09-03 ENCOUNTER — Other Ambulatory Visit: Payer: Self-pay

## 2019-09-03 ENCOUNTER — Telehealth (INDEPENDENT_AMBULATORY_CARE_PROVIDER_SITE_OTHER): Payer: Managed Care, Other (non HMO) | Admitting: Neurology

## 2019-09-03 VITALS — Ht 71.0 in | Wt 225.0 lb

## 2019-09-03 DIAGNOSIS — M533 Sacrococcygeal disorders, not elsewhere classified: Secondary | ICD-10-CM | POA: Diagnosis not present

## 2019-09-03 DIAGNOSIS — G8929 Other chronic pain: Secondary | ICD-10-CM | POA: Diagnosis not present

## 2019-09-03 DIAGNOSIS — M545 Low back pain, unspecified: Secondary | ICD-10-CM

## 2019-09-03 DIAGNOSIS — R202 Paresthesia of skin: Secondary | ICD-10-CM | POA: Diagnosis not present

## 2019-09-03 NOTE — Telephone Encounter (Signed)
Called patient per Dr. Posey Pronto and left a message to call back to schedule a 6 month follow up appointment.

## 2019-09-03 NOTE — Progress Notes (Signed)
   Virtual Visit via Video Note The purpose of this virtual visit is to provide medical care while limiting exposure to the novel coronavirus.    Consent was obtained for video visit:  Yes.   Answered questions that patient had about telehealth interaction:  Yes.   I discussed the limitations, risks, security and privacy concerns of performing an evaluation and management service by telemedicine. I also discussed with the patient that there may be a patient responsible charge related to this service. The patient expressed understanding and agreed to proceed.  Pt location: Home Physician Location: office Name of referring provider:  Venia Carbon, MD I connected with Paul Holland at patients initiation/request on 09/03/2019 at  7:50 AM EDT by video enabled telemedicine application and verified that I am speaking with the correct person using two identifiers. Pt MRN:  [id number] Pt DOB:  07/06/1971 Video Participants:  Paul Holland   History of Present Illness: This is a 48 y.o. male returning for follow-up of low back pain and bilateral feet paresthesias.  Today, he also complains of new achy pain down in the tailbone region.  This pain is triggered by prolonged sitting, and alleviated by stretching and walking.  There is no burning, sharp pain, or tingling in the region or radiating into the leg.  His feet paresthesias are well controlled on gabapentin 300 mg at bedtime.  Low back pain continues to be responsive to dry needling, which he gets every 6 weeks.  Overall, he feels that symptoms are very stable, except for new pain in the tailbone region.  Observations/Objective:   Vitals:   09/01/19 0945  Weight: 225 lb (102.1 kg)  Height: 5\' 11"  (1.803 m)   Patient is awake, alert, and appears comfortable.  Oriented x 4.   Extraocular muscles are intact. No ptosis.  Face is symmetric.  Speech is not dysarthric.  Antigravity in all extremities.    Assessment and Plan:  1.   Bilateral feet paresthesias, possibly early neuropathy symptoms are responsive to gabapentin.  Electrodiagnostic testing and MRI lumbar spine is unremarkable.  -Continue gabapentin 300 mg at bedtime  2.  Lumbar strain, improved with physical therapy and dry needling  -Continue Flexeril 5 mg at bedtime as needed  3.  Coccydynia - new.  Recommend follow-up with primary care doctor for further evaluation and management.   Follow Up Instructions:   I discussed the assessment and treatment plan with the patient. The patient was provided an opportunity to ask questions and all were answered. The patient agreed with the plan and demonstrated an understanding of the instructions.   The patient was advised to call back or seek an in-person evaluation if the symptoms worsen or if the condition fails to improve as anticipated.  Follow-up in 6 months  Total time spent:  15 minutes     Alda Berthold, DO

## 2019-09-08 NOTE — Telephone Encounter (Signed)
Scheduled 03/05/20 at 1:30 PM.

## 2019-09-10 ENCOUNTER — Telehealth: Payer: Managed Care, Other (non HMO) | Admitting: Neurology

## 2019-09-12 ENCOUNTER — Ambulatory Visit: Payer: Managed Care, Other (non HMO) | Admitting: Neurology

## 2019-10-24 ENCOUNTER — Ambulatory Visit (INDEPENDENT_AMBULATORY_CARE_PROVIDER_SITE_OTHER): Payer: Managed Care, Other (non HMO) | Admitting: Internal Medicine

## 2019-10-24 ENCOUNTER — Other Ambulatory Visit: Payer: Self-pay

## 2019-10-24 ENCOUNTER — Encounter: Payer: Self-pay | Admitting: Internal Medicine

## 2019-10-24 VITALS — BP 114/72 | HR 77 | Temp 98.4°F | Ht 70.0 in | Wt 213.0 lb

## 2019-10-24 DIAGNOSIS — J683 Other acute and subacute respiratory conditions due to chemicals, gases, fumes and vapors: Secondary | ICD-10-CM

## 2019-10-24 DIAGNOSIS — Z1211 Encounter for screening for malignant neoplasm of colon: Secondary | ICD-10-CM

## 2019-10-24 DIAGNOSIS — G629 Polyneuropathy, unspecified: Secondary | ICD-10-CM | POA: Diagnosis not present

## 2019-10-24 DIAGNOSIS — Z0000 Encounter for general adult medical examination without abnormal findings: Secondary | ICD-10-CM | POA: Diagnosis not present

## 2019-10-24 LAB — COMPREHENSIVE METABOLIC PANEL
ALT: 19 U/L (ref 0–53)
AST: 17 U/L (ref 0–37)
Albumin: 4.8 g/dL (ref 3.5–5.2)
Alkaline Phosphatase: 68 U/L (ref 39–117)
BUN: 16 mg/dL (ref 6–23)
CO2: 30 mEq/L (ref 19–32)
Calcium: 9.6 mg/dL (ref 8.4–10.5)
Chloride: 101 mEq/L (ref 96–112)
Creatinine, Ser: 1.06 mg/dL (ref 0.40–1.50)
GFR: 74.25 mL/min (ref >60.00)
Glucose, Bld: 96 mg/dL (ref 70–99)
Potassium: 4.1 mEq/L (ref 3.5–5.1)
Sodium: 137 mEq/L (ref 135–145)
Total Bilirubin: 0.7 mg/dL (ref 0.2–1.2)
Total Protein: 7.6 g/dL (ref 6.0–8.3)

## 2019-10-24 LAB — LIPID PANEL
Cholesterol: 169 mg/dL (ref 0–200)
HDL: 44.3 mg/dL (ref >39.00)
LDL Cholesterol: 114 mg/dL — ABNORMAL HIGH (ref 0–99)
NonHDL: 124.97
Total CHOL/HDL Ratio: 4
Triglycerides: 55 mg/dL (ref 0.0–149.0)
VLDL: 11 mg/dL (ref 0.0–40.0)

## 2019-10-24 LAB — CBC
HCT: 47.4 % (ref 39.0–52.0)
Hemoglobin: 16.5 g/dL (ref 13.0–17.0)
MCHC: 34.7 g/dL (ref 30.0–36.0)
MCV: 92.3 fl (ref 78.0–100.0)
Platelets: 222 10*3/uL (ref 150.0–400.0)
RBC: 5.14 Mil/uL (ref 4.22–5.81)
RDW: 13.2 % (ref 11.5–15.5)
WBC: 3.2 10*3/uL — ABNORMAL LOW (ref 4.0–10.5)

## 2019-10-24 NOTE — Assessment & Plan Note (Signed)
Does okay with the gabapentin at bedtime

## 2019-10-24 NOTE — Assessment & Plan Note (Signed)
Controlled on the advair

## 2019-10-24 NOTE — Assessment & Plan Note (Addendum)
Healthy Has gotten more fit Had flu vaccine Will set up for colon Will check some routine labs as part of his wellness visit

## 2019-10-24 NOTE — Progress Notes (Signed)
Subjective:    Patient ID: Paul Holland, male    DOB: 09/27/1971, 48 y.o.   MRN: [id number]  HPI Here for physical This visit occurred during the SARS-CoV-2 public health emergency.  Safety protocols were in place, including screening questions prior to the visit, additional usage of staff PPE, and extensive cleaning of exam room while observing appropriate contact time as indicated for disinfecting solutions.   Has started with "shadows" in right eye Normal exam a month ago Now with black spots---is set up for appt on Monday  Having some soreness and pain in tailbone area Didn't know if that was related to his ongoing neuropathy No recent injury there  Continues on advair for the asthma Tries to walk regularly--walking and recumbent bike/stepper  Current Outpatient Medications on File Prior to Visit  Medication Sig Dispense Refill  . cyclobenzaprine (FLEXERIL) 5 MG tablet TAKE 1 TABLET BY MOUTH AT BEDTIME AS NEEDED FOR MUSCLE SPASM 30 tablet 5  . Fluticasone-Salmeterol (ADVAIR) 250-50 MCG/DOSE AEPB Inhale 1 puff into the lungs 2 (two) times daily. 60 each 11  . gabapentin (NEURONTIN) 300 MG capsule TAKE 1 CAPSULE BY MOUTH TWICE DAILY 60 capsule 5  . loratadine (CLARITIN) 10 MG tablet Take 10-20 mg by mouth daily.    Marland Kitchen MISC NATURAL PRODUCTS PO Take by mouth. Turmeric    . Multiple Vitamin (MULTI-VITAMINS) TABS Take 1 tablet by mouth daily.    . Omega-3 Fatty Acids (FISH OIL PO) Take by mouth.     No current facility-administered medications on file prior to visit.     No Known Allergies  Past Medical History:  Diagnosis Date  . Allergic rhinitis due to pollen   . 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 Heart murmur    probably mild MR. Sees Dr Nehemiah Massed  . Osteoarthritis of wrist     Past Surgical History:  Procedure Laterality Date  . Cartilage removed from ribs  1981  . KNEE ARTHROSCOPY WITH ANTERIOR CRUCIATE LIGAMENT (ACL) REPAIR  6/14 & 8/14    meniscus repair and tibial fracture repair (bone graft). ACL not repaired    Family History  Problem Relation Age of Onset  . Hypertension Mother   . Hypertension Father   . Diabetes Brother   . Hyperlipidemia Brother   . Hypertension Brother   . Cancer Paternal Uncle        colon  . Cancer Paternal Grandfather   . Heart disease Neg Hx     Social History   Socioeconomic History  . Marital status: Married    Spouse name: Not on file  . Number of children: 3  . Years of education: 93  . Highest education level: Some college, no degree  Occupational History  . 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 Financial resource strain: Not on file  . Food insecurity    Worry: Not on file    Inability: Not on file  . Transportation needs    Medical: Not on file    Non-medical: Not on file  Tobacco Use  . Smoking status: Former Research scientist (life sciences)  . Smokeless tobacco: Never Used  Substance and Sexual Activity  . Alcohol use: Yes    Comment: Occasionally - sporadic  . Drug use: No  . Sexual activity: Not on file  Lifestyle  . Physical activity    Days per week: Not on file    Minutes per session: Not  on file  . Stress: Not on file  Relationships  . Social Herbalist on phone: Not on file    Gets together: Not on file    Attends religious service: Not on file    Active member of club or organization: Not on file    Attends meetings of clubs or organizations: Not on file    Relationship status: Not on file  . Intimate partner violence    Fear of current or ex partner: Not on file    Emotionally abused: Not on file    Physically abused: Not on file    Forced sexual activity: Not on file  Other Topics Concern  . Not on file  Social History Narrative   2nd marriage   3 children who live with their mother in [place]----regular visits to them.   Lives in a 2 story home.     Education: some college.  Works as a Librarian, academic for a  [employer]           Review of Systems  Constitutional: Negative for fatigue.       Has lost some weight Wears seat belt  HENT: Negative for dental problem and hearing loss.        Keeps up with dentist Mild tinnitus in left ear  Eyes: Negative for visual disturbance.       No diplopia or unilateral vision loss  Respiratory: Negative for cough and wheezing.   Cardiovascular: Negative for chest pain, palpitations and leg swelling.  Gastrointestinal: Negative for abdominal pain and constipation.       Some harder stools--- occasional red blood visible No heartburn  Endocrine: Negative for polydipsia and polyuria.  Genitourinary: Negative for urgency.       Slight dribbling at times. Rare slow flow No ED  Musculoskeletal:       Ongoing low back pain---gets dry needled every 6-8 weeks Does back stretches  Skin: Negative for rash.  Allergic/Immunologic: Positive for environmental allergies. Negative for immunocompromised state.       Uses claritin--may be mostly dust, etc  Neurological: Negative for dizziness, syncope, light-headedness and headaches.  Hematological: Negative for adenopathy. Does not bruise/bleed easily.  Psychiatric/Behavioral: Negative for dysphoric mood and sleep disturbance.       Mild anxiety---meets with counselor monthly and this helps       Objective:   Physical Exam  Constitutional: He is oriented to person, place, and time. He appears well-developed. No distress.  HENT:  Head: Normocephalic and atraumatic.  Right Ear: External ear normal.  Left Ear: External ear normal.  Mouth/Throat: Oropharynx is clear and moist. No oropharyngeal exudate.  Eyes: Pupils are equal, round, and reactive to light. Conjunctivae are normal.  Neck: No thyromegaly present.  Cardiovascular: Normal rate, regular rhythm, normal heart sounds and intact distal pulses. Exam reveals no gallop.  No murmur heard. Respiratory: Effort normal and breath sounds normal. No  respiratory distress. He has no wheezes. He has no rales.  GI: Soft. There is no abdominal tenderness.  Musculoskeletal:        General: No tenderness or edema.     Comments: Slight tenderness over coccyx. No cyst  Lymphadenopathy:    He has no cervical adenopathy.  Neurological: He is alert and oriented to person, place, and time.  Skin: No rash noted.  Many benign nevi  Psychiatric: He has a normal mood and affect. His behavior is normal.  Assessment & Plan:

## 2019-10-29 ENCOUNTER — Encounter: Payer: Self-pay | Admitting: Gastroenterology

## 2019-11-04 ENCOUNTER — Ambulatory Visit: Payer: Managed Care, Other (non HMO) | Admitting: Gastroenterology

## 2019-11-04 ENCOUNTER — Encounter: Payer: Self-pay | Admitting: Gastroenterology

## 2019-11-04 VITALS — BP 110/70 | HR 80 | Temp 98.3°F | Ht 70.0 in | Wt 213.5 lb

## 2019-11-04 DIAGNOSIS — Z8371 Family history of colonic polyps: Secondary | ICD-10-CM | POA: Diagnosis not present

## 2019-11-04 DIAGNOSIS — R194 Change in bowel habit: Secondary | ICD-10-CM | POA: Diagnosis not present

## 2019-11-04 DIAGNOSIS — Z800 Family history of malignant neoplasm of digestive organs: Secondary | ICD-10-CM | POA: Diagnosis not present

## 2019-11-04 DIAGNOSIS — Z1159 Encounter for screening for other viral diseases: Secondary | ICD-10-CM

## 2019-11-04 DIAGNOSIS — K625 Hemorrhage of anus and rectum: Secondary | ICD-10-CM | POA: Diagnosis not present

## 2019-11-04 DIAGNOSIS — Z83719 Family history of colon polyps, unspecified: Secondary | ICD-10-CM

## 2019-11-04 MED ORDER — SUPREP BOWEL PREP KIT 17.5-3.13-1.6 GM/177ML PO SOLN: 1.0000 | ORAL | 0 refills | Status: DC

## 2019-11-04 NOTE — Progress Notes (Signed)
GASTROENTEROLOGY OUTPATIENT CLINIC VISIT   Primary Care Provider Venia Carbon, MD [place] [address] [phone]  Referring Provider Venia Carbon, MD [address] [place],  [place] [address] [phone]  Patient Profile: Paul Holland is a 48 y.o. male with a pmh significant for Asthma, Anxiety, Osteoarthritis, FHx Colon Polyps (Father in 68s), FHx Colon Cancer ([facility]).  The patient presents to the [facility] for an evaluation and management of problem(s) noted below:  Problem List 1. Family history of colonic polyps   2. FHx: colon cancer   3. BRBPR (bright red blood per rectum)   4. Change in bowel habits   5. Screening for viral disease     History of Present Illness This is the patient's first visit to the outpatient [place] clinic.  The patient has a family history of colon polyps in his father beginning in his early 22s.  There is a family history of a paternal uncle greater than the age of 16 who has a history of colon cancer.  The patient himself has bowel movements on a daily basis.  However more recently he has noted some slight changes in his bowel habits such that the stools seem to be harder.  His diet has changed as he is eating less carbs at this time and probably taking in less fiber.  Since July, he will note 1 occasion per week where he may have bright red blood per rectum on the toilet paper.  Years ago he was told he had hemorrhoids when he had bright red blood per rectum and was evaluated by his primary doctor.  No issues since.  Patient fiber intake is only Chia seeds that he takes in his smoothies but he does not take any fiber supplements.  He does recall having had prior stool based testing and it was negative per his report but he has never had a Cologuard.  His PCP wonders about the role of colonoscopy to evaluate symptoms and also for preventative screening purposes.  The patient has had  issues with gas/bloating but this is more often related to constipation when he has a bowel movement and improved significantly.  He denies any significant reflux or abdominal pains otherwise.  He denies any nausea or vomit.  The patient does not take significant nonsteroidals on a regular basis.  He has never had an upper or lower endoscopy.  GI Review of Systems Positive as above Negative for dysphagia, odynophagia, pyrosis, early satiety, melena  Review of Systems General: Denies fevers/chills/weight loss HEENT: Denies oral lesions Cardiovascular: Denies chest pain/palpitations Pulmonary: Denies shortness of breath/cough Gastroenterological: See HPI Genitourinary: Denies darkened urine Hematological: Denies easy bruising/bleeding Endocrine: Denies temperature intolerance Dermatological: Denies jaundice Psychological: Mood is stable and although he battles with anxiety he feels it is not significant at this time   Medications Current Outpatient Medications  Medication Sig Dispense Refill   cyclobenzaprine (FLEXERIL) 5 MG tablet TAKE 1 TABLET BY MOUTH AT BEDTIME AS NEEDED FOR MUSCLE SPASM 30 tablet 5   Fluticasone-Salmeterol (ADVAIR) 250-50 MCG/DOSE AEPB Inhale 1 puff into the lungs 2 (two) times daily. 60 each 11   gabapentin (NEURONTIN) 300 MG capsule TAKE 1 CAPSULE BY MOUTH TWICE DAILY 60 capsule 5   GARLIC PO Take 1 capsule by mouth daily.     loratadine (CLARITIN) 10 MG tablet Take 10-20 mg by mouth daily.     Multiple Vitamin (MULTI-VITAMINS) TABS Take 1 tablet by  mouth daily.     Omega-3 Fatty Acids (FISH OIL PO) Take by mouth.     TURMERIC PO Take 1 capsule by mouth daily.     Na Sulfate-K Sulfate-Mg Sulf (SUPREP BOWEL PREP KIT) 17.5-3.13-1.6 GM/177ML SOLN Take 1 kit by mouth as directed. For colonoscopy prep 354 mL 0   No current facility-administered medications for this visit.     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 Sees Dr Nehemiah Massed   Osteoarthritis of wrist    Past Surgical History:  Procedure Laterality Date   Cartilage removed from ribs  1981   KNEE ARTHROSCOPY WITH ANTERIOR CRUCIATE LIGAMENT (ACL) REPAIR Right 6/14 & 8/14   meniscus repair and tibial fracture repair (bone graft). ACL not repaired x 3   Social History   Socioeconomic History   Marital status: Married    Spouse name: Not on file   Number of children: 3   Years of education: 14   Highest education level: Some college, no degree  Occupational History   Occupation: Librarian, academic-- factory    Comment: Doctor, hospital strain: Not on file   Food insecurity    Worry: Not on file    Inability: Not on file   Transportation needs    Medical: Not on file    Non-medical: Not on file  Tobacco Use   Smoking status: Former Smoker    Types: Cigarettes    Quit date: 11/04/2007    Years since quitting: 12.0   Smokeless tobacco: Never Used  Substance and Sexual Activity   Alcohol use: Yes    Comment: Occasionally - sporadic   Drug use: No   Sexual activity: Not on file  Lifestyle   Physical activity    Days per week: Not on file    Minutes per session: Not on file   Stress: Not on file  Relationships   Social connections    Talks on phone: Not on file    Gets together: Not on file    Attends religious service: Not on file    Active member of club or organization: Not on file    Attends meetings of clubs or organizations: Not on file    Relationship status: Not on file   Intimate partner violence    Fear of current or ex partner: Not on file    Emotionally abused: Not on file    Physically abused: Not on file    Forced sexual activity: Not on file  Other Topics Concern   Not on file  Social History  Narrative   2nd marriage   3 children who live with their mother in [place]----regular visits to them.   Lives in a 2 story home.     Education: some college.  Works as a Librarian, academic for a [employer]        Family History  Problem Relation Age of Onset   Hypertension Mother    Diabetes Mother    Hypertension Father    Colon polyps Father    Diabetes Brother    Hyperlipidemia Brother    Hypertension Brother    Colon cancer Paternal Uncle    Lung cancer Paternal Grandfather    Heart disease  Neg Hx    Esophageal cancer Neg Hx    Inflammatory bowel disease Neg Hx    Liver disease Neg Hx    Pancreatic cancer Neg Hx    Rectal cancer Neg Hx    Stomach cancer Neg Hx    I have reviewed his medical, social, and family history in detail and updated the electronic medical record as necessary.    PHYSICAL EXAMINATION  BP 110/70 (BP Location: Left Arm, Patient Position: Sitting, Cuff Size: Normal)    Pulse 80    Temp 98.3 F (36.8 C)    Ht 5' 10"  (1.778 m) Comment: height measured without shoes   Wt 213 lb 8 oz (96.8 kg)    BMI 30.63 kg/m  Wt Readings from Last 3 Encounters:  11/04/19 213 lb 8 oz (96.8 kg)  10/24/19 213 lb (96.6 kg)  09/01/19 225 lb (102.1 kg)  GEN: NAD, appears stated age, doesn't appear chronically ill PSYCH: Cooperative, without pressured speech EYE: Conjunctivae pink, sclerae anicteric ENT: MMM, without oral ulcers, no erythema or exudates noted NECK: Supple CV: RR without R/Gs  RESP: CTAB posteriorly, without wheezing GI: NABS, soft, NT/ND, without rebound or guarding, no HSM appreciated GU: DRE deferred as he is willing to have a colonoscopy in the next few weeks and would like to have it done at that time MSK/EXT: No lower extremity edema SKIN: No jaundice NEURO:  Alert & Oriented x 3, no focal deficits   REVIEW OF DATA  I reviewed the following data at the time of this encounter:  GI Procedures and Studies  No relevant  studies  Laboratory Studies  Reviewed those in EPIC  Imaging Studies  No relevant studies   ASSESSMENT  Mr. Jindra is a 48 y.o. male with a pmh significant for Asthma, Anxiety, Osteoarthritis, FHx Colon Polyps (Father in 63s), FHx Colon Cancer (PUnc).  The patient is seen today for evaluation and management of:  1. Family history of colonic polyps   2. FHx: colon cancer   3. BRBPR (bright red blood per rectum)   4. Change in bowel habits   5. Screening for viral disease    The patient is hemodynamically and clinically stable.  American Cancer Society guidelines suggest colon cancer screening begin at the age of 76 for average risk individuals.  This patient's father had early colon polyps in the age of 19 thus he would normally be recommended if we knew that this was an advanced adenoma to begin colon cancer screening and colonoscopy 10 years prior to the initiation of his father's colon polyp findings and he would have likely start in his 32s.  We will proceed with a increased risk first screening.  We will also be able to evaluate for his incidental bright red blood per rectum and ensure there is nothing more that hemorrhoidal disease.  We will asked the patient to optimize his fiber intake by continuing his dietary fiber but also ask him to add in [place] once daily versus Metamucil once daily my preference being FiberCon due to it being less bloatogenic.  He may use stool softeners as needed but I think the fiber will have better effect.  The risks and benefits of endoscopic evaluation were discussed with the patient; these include but are not limited to the risk of perforation, infection, bleeding, missed lesions, lack of diagnosis, severe illness requiring hospitalization, as well as anesthesia and sedation related illnesses.  The patient is agreeable to proceed.  All patient questions were  answered, to the best of my ability, and the patient agrees to the aforementioned plan of action with  follow-up as indicated.   PLAN  Proceed with scheduling Colonoscopy FiberCon or Metamucil once daily Patient has reached out to his insurance and has been told that he will be covered   Orders Placed This Encounter  Procedures   SARS Coronavirus 2 ([facility] ONLY)   Ambulatory referral to Gastroenterology    New Prescriptions   NA SULFATE-K SULFATE-MG SULF (SUPREP BOWEL PREP KIT) 17.5-3.13-1.6 GM/177ML SOLN    Take 1 kit by mouth as directed. For colonoscopy prep   Modified Medications   No medications on file    Planned Follow Up No follow-ups on file.   Justice Britain, MD [facility]oscopy Office # [phone]

## 2019-11-04 NOTE — Patient Instructions (Addendum)
If you are age 49 or older, your body mass index should be between 23-30. Your Body mass index is 30.63 kg/m. If this is out of the aforementioned range listed, please consider follow up with your Primary Care Provider.  If you are age 17 or younger, your body mass index should be between 19-25. Your Body mass index is 30.63 kg/m. If this is out of the aformentioned range listed, please consider follow up with your Primary Care Provider.    You have been scheduled for a colonoscopy. Please follow written instructions given to you at your visit today.  Please pick up your prep supplies at the pharmacy within the next 1-3 days. If you use inhalers (even only as needed), please bring them with you on the day of your procedure.  Thank you for choosing me and [facility].  Dr. Rush Landmark

## 2019-11-06 DIAGNOSIS — Z800 Family history of malignant neoplasm of digestive organs: Secondary | ICD-10-CM | POA: Insufficient documentation

## 2019-11-06 DIAGNOSIS — Z8371 Family history of colonic polyps: Secondary | ICD-10-CM | POA: Insufficient documentation

## 2019-11-06 DIAGNOSIS — Z1159 Encounter for screening for other viral diseases: Secondary | ICD-10-CM | POA: Insufficient documentation

## 2019-11-06 DIAGNOSIS — K625 Hemorrhage of anus and rectum: Secondary | ICD-10-CM | POA: Insufficient documentation

## 2019-11-06 DIAGNOSIS — R194 Change in bowel habit: Secondary | ICD-10-CM | POA: Insufficient documentation

## 2019-11-19 ENCOUNTER — Ambulatory Visit (INDEPENDENT_AMBULATORY_CARE_PROVIDER_SITE_OTHER)
Admission: RE | Admit: 2019-11-19 | Discharge: 2019-11-19 | Disposition: A | Discharge status: Home / Self Care | Payer: Managed Care, Other (non HMO) | Source: Ambulatory Visit | Attending: Family Medicine | Admitting: Family Medicine

## 2019-11-19 ENCOUNTER — Ambulatory Visit: Payer: Managed Care, Other (non HMO) | Admitting: Family Medicine

## 2019-11-19 ENCOUNTER — Encounter: Payer: Self-pay | Admitting: Family Medicine

## 2019-11-19 ENCOUNTER — Other Ambulatory Visit: Payer: Self-pay

## 2019-11-19 VITALS — BP 110/78 | HR 74 | Temp 98.0°F | Ht 70.0 in | Wt 214.0 lb

## 2019-11-19 DIAGNOSIS — M533 Sacrococcygeal disorders, not elsewhere classified: Secondary | ICD-10-CM

## 2019-11-19 MED ORDER — PREDNISONE 20 MG PO TABS: ORAL_TABLET | ORAL | 0 refills | Status: DC

## 2019-11-19 NOTE — Progress Notes (Signed)
Khalis Hittle T. Earlin Sweeden, MD Primary Care and Gas at [facility] [place], [address] Phone: [phone]  FAX: [place] - 48 y.o. male  MRN [id number]  Date of Birth: 03-31-1971  Visit Date: 11/19/2019  PCP: Venia Carbon, MD  Referred by: Venia Carbon, MD  Chief Complaint  Patient presents with  . Pain in Tailbone    No Injury    This visit occurred during the SARS-CoV-2 public health emergency.  Safety protocols were in place, including screening questions prior to the visit, additional usage of staff PPE, and extensive cleaning of exam room while observing appropriate contact time as indicated for disinfecting solutions.   Subjective:   Paul Holland is a 48 y.o. very pleasant male patient with Body mass index is 30.71 kg/m. who presents with the following:  He presents with lower back / sacral pain. Gets some pain in the tailbone.  Saw Dr. Carlean Jews last month and had some pain.  Thought he had a bruise and come in to see me.  He is not having specific pain directly on the coccyx.  He is having significant pain with sitting.  He is not had any issues with going to the bathroom in terms of stool or urination.  This does not provoke his pain.  He does take a daily bowel movement.  It is focal on the coccyx itself without pain in the lower back and no radicular pain.  Supervisor in a Health visitor.  Daily BM.  No change in stool - colon pending.   Tailbone pain at the base.   Steroids - chiropractor  Past Medical History, Surgical History, Social History, Family History, Problem List, Medications, and Allergies have been reviewed and updated if relevant.  Patient Active Problem List   Diagnosis Date Noted  . Screening for viral disease 11/06/2019  . Change in bowel habits 11/06/2019  . BRBPR (bright red blood per rectum) 11/06/2019  . Family history of colonic polyps 11/06/2019  .  FHx: colon cancer 11/06/2019  . Neuropathy 05/24/2018  . Routine general medical examination at a health care facility 08/07/2012  . 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 Mitral regurgitation     Past Medical History:  Diagnosis Date  . Allergic rhinitis due to pollen   . Anxiety   . Asthma   . 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 Heart murmur    probably mild MR. Sees Dr Nehemiah Massed  . Osteoarthritis of wrist     Past Surgical History:  Procedure Laterality Date  . Cartilage removed from ribs  1981  . KNEE ARTHROSCOPY WITH ANTERIOR CRUCIATE LIGAMENT (ACL) REPAIR Right 6/14 & 8/14   meniscus repair and tibial fracture repair (bone graft). ACL not repaired x 3    Social History   Socioeconomic History  . Marital status: Married    Spouse name: Not on file  . Number of children: 3  . Years of education: 75  . Highest education level: Some college, no degree  Occupational History  . Occupation: Librarian, academic-- factory    Comment: Systems developer  Tobacco Use  . Smoking status: Former Smoker    Types: Cigarettes    Quit date: 11/04/2007    Years since quitting: 12.0  . Smokeless tobacco: Never Used  Substance and Sexual Activity  . Alcohol use: Yes    Comment: Occasionally - sporadic  .  Drug use: No  . Sexual activity: Not on file  Other Topics Concern  . Not on file  Social History Narrative   2nd marriage   3 children who live with their mother in [place]----regular visits to them.   Lives in a 2 story home.     Education: some college.  Works as a Librarian, academic for a [employer]        Social Determinants of Health   Financial Resource Strain:   . Difficulty of Paying Living Expenses: Not on file  Food Insecurity:   . Worried About Charity fundraiser in the Last Year: Not on file  . Ran Out of Food in the Last Year: Not on file  Transportation Needs:   . Lack of Transportation (Medical): Not on file  . Lack of Transportation  (Non-Medical): Not on file  Physical Activity:   . Days of Exercise per Week: Not on file  . Minutes of Exercise per Session: Not on file  Stress:   . Feeling of Stress : Not on file  Social Connections:   . Frequency of Communication with Friends and Family: Not on file  . Frequency of Social Gatherings with Friends and Family: Not on file  . Attends Religious Services: Not on file  . Active Member of Clubs or Organizations: Not on file  . Attends Archivist Meetings: Not on file  . Marital Status: Not on file  Intimate Partner Violence:   . Fear of Current or Ex-Partner: Not on file  . Emotionally Abused: Not on file  . Physically Abused: Not on file  . Sexually Abused: Not on file    Family History  Problem Relation Age of Onset  . Hypertension Mother   . Diabetes Mother   . Hypertension Father   . Colon polyps Father   . Diabetes Brother   . Hyperlipidemia Brother   . Hypertension Brother   . Colon cancer Paternal Uncle   . Lung cancer Paternal Grandfather   . Heart disease Neg Hx   . Esophageal cancer Neg Hx   . Inflammatory bowel disease Neg Hx   . Liver disease Neg Hx   . Pancreatic cancer Neg Hx   . Rectal cancer Neg Hx   . Stomach cancer Neg Hx     No Known Allergies  Medication list reviewed and updated in full in [place].  GEN: No fevers, chills. Nontoxic. Primarily MSK c/o today. MSK: Detailed in the HPI GI: tolerating PO intake without difficulty Neuro: No numbness, parasthesias, or tingling associated. Otherwise the pertinent positives of the ROS are noted above.   Objective:   BP 110/78   Pulse 74   Temp 98 F (36.7 C) (Temporal)   Ht 5' 10"  (1.778 m)   Wt 214 lb (97.1 kg)   SpO2 97%   BMI 30.71 kg/m    GEN: WDWN, NAD, Non-toxic, Alert & Oriented x 3 HEENT: Atraumatic, Normocephalic.  Ears and Nose: No external deformity. EXTR: No clubbing/cyanosis/edema NEURO: Normal gait.  PSYCH: Normally interactive. Conversant.  Not depressed or anxious appearing.  Calm demeanor.    The lumbar spine from L1-S1 bilaterally is nontender.  He does have some tenderness in the SI joints, there is minimal posterior pelvic pain.  The sacrum is nontender.  At the coccyx he does have tenderness to palpation with manipulation.  Radiology: DG Sacrum/Coccyx  Result Date: 11/20/2019 CLINICAL DATA:  Pain in coccyx. EXAM: SACRUM AND COCCYX - 2+ VIEW  COMPARISON:  No prior. FINDINGS: Distal sacrum and coccyx difficult to visualize on lateral view, most likely secondary to technique. Second and coccyx appear normal on AP views. SI joints intact. No acute or focal notified. IMPRESSION: No acute or focal abnormality identified. Electronically Signed   By: Marcello Moores  Register   On: 11/20/2019 06:59     Assessment and Plan:     ICD-10-CM   1. Coccyx pain  M53.3 DG Sacrum/Coccyx   Coccydynia is challenging in some ways, I think at this point he is failed some outpatient management.  I am going to have him do 14 days of steroids, and think that him doing some manipulation with the chiropractor will be of benefit.  On his plain films are does appear to be some apex dorsal angulation of the coccyx.  Many other options exist including PT and pelvic floor PT.  I asked him to call me if he is not having significant improvement in 6 weeks.  Follow-up: No follow-ups on file.  Meds ordered this encounter  Medications  . predniSONE (DELTASONE) 20 MG tablet    Sig: 2 tabs po for 7 days, then 1 tab po for 7 days    Dispense:  21 tablet    Refill:  0   Orders Placed This Encounter  Procedures  . DG Sacrum/Coccyx    Signed,  Maud Deed. Kennidee Heyne, MD   Outpatient Encounter Medications as of 11/19/2019  Medication Sig  . cyclobenzaprine (FLEXERIL) 5 MG tablet TAKE 1 TABLET BY MOUTH AT BEDTIME AS NEEDED FOR MUSCLE SPASM  . Fluticasone-Salmeterol (ADVAIR) 250-50 MCG/DOSE AEPB Inhale 1 puff into the lungs 2 (two) times daily.  Marland Kitchen gabapentin  (NEURONTIN) 300 MG capsule TAKE 1 CAPSULE BY MOUTH TWICE DAILY  . GARLIC PO Take 1 capsule by mouth daily.  Marland Kitchen loratadine (CLARITIN) 10 MG tablet Take 10-20 mg by mouth daily.  . Multiple Vitamin (MULTI-VITAMINS) TABS Take 1 tablet by mouth daily.  . Na Sulfate-K Sulfate-Mg Sulf (SUPREP BOWEL PREP KIT) 17.5-3.13-1.6 GM/177ML SOLN Take 1 kit by mouth as directed. For colonoscopy prep  . Omega-3 Fatty Acids (FISH OIL PO) Take by mouth.  . TURMERIC PO Take 1 capsule by mouth daily.  . predniSONE (DELTASONE) 20 MG tablet 2 tabs po for 7 days, then 1 tab po for 7 days   No facility-administered encounter medications on file as of 11/19/2019.

## 2019-12-09 ENCOUNTER — Encounter: Payer: Self-pay | Admitting: Neurology

## 2019-12-24 ENCOUNTER — Other Ambulatory Visit: Payer: Self-pay | Admitting: Gastroenterology

## 2019-12-24 ENCOUNTER — Ambulatory Visit (INDEPENDENT_AMBULATORY_CARE_PROVIDER_SITE_OTHER): Payer: Managed Care, Other (non HMO)

## 2019-12-24 DIAGNOSIS — Z1159 Encounter for screening for other viral diseases: Secondary | ICD-10-CM

## 2019-12-25 LAB — SARS CORONAVIRUS 2 (TAT 6-24 HRS): SARS Coronavirus 2: NEGATIVE

## 2019-12-26 ENCOUNTER — Encounter: Payer: Self-pay | Admitting: Gastroenterology

## 2019-12-26 ENCOUNTER — Ambulatory Visit (AMBULATORY_SURGERY_CENTER): Payer: Managed Care, Other (non HMO) | Admitting: Gastroenterology

## 2019-12-26 ENCOUNTER — Other Ambulatory Visit: Payer: Self-pay

## 2019-12-26 VITALS — BP 113/74 | HR 75 | Temp 98.4°F | Resp 15 | Ht 70.0 in | Wt 213.0 lb

## 2019-12-26 DIAGNOSIS — D120 Benign neoplasm of cecum: Secondary | ICD-10-CM

## 2019-12-26 DIAGNOSIS — D123 Benign neoplasm of transverse colon: Secondary | ICD-10-CM

## 2019-12-26 DIAGNOSIS — Z8371 Family history of colonic polyps: Secondary | ICD-10-CM

## 2019-12-26 DIAGNOSIS — K625 Hemorrhage of anus and rectum: Secondary | ICD-10-CM | POA: Diagnosis present

## 2019-12-26 MED ORDER — SODIUM CHLORIDE 0.9 % IV SOLN: 500.0000 mL | Freq: Once | INTRAVENOUS | Status: DC

## 2019-12-26 NOTE — Progress Notes (Signed)
Pt tolerated well. VSS. Awake and to recovery. 

## 2019-12-26 NOTE — Progress Notes (Signed)
Pt's states no medical or surgical changes since previsit or office visit.  Vitals- Donna Temp- June 

## 2019-12-26 NOTE — Patient Instructions (Signed)
Use FiberCon 1 tablet by mouth daily. This is an over-the-counter medication. High fiber diet.  Handouts provided on High-fiber diet, hemorrhoids, and polyps.  YOU HAD AN ENDOSCOPIC PROCEDURE TODAY AT [facility]:   Refer to the procedure report that was given to you for any specific questions about what was found during the examination.  If the procedure report does not answer your questions, please call your gastroenterologist to clarify.  If you requested that your care partner not be given the details of your procedure findings, then the procedure report has been included in a sealed envelope for you to review at your convenience later.  YOU SHOULD EXPECT: Some feelings of bloating in the abdomen. Passage of more gas than usual.  Walking can help get rid of the air that was put into your GI tract during the procedure and reduce the bloating. If you had a lower endoscopy (such as a colonoscopy or flexible sigmoidoscopy) you may notice spotting of blood in your stool or on the toilet paper. If you underwent a bowel prep for your procedure, you may not have a normal bowel movement for a few days.  Please Note:  You might notice some irritation and congestion in your nose or some drainage.  This is from the oxygen used during your procedure.  There is no need for concern and it should clear up in a day or so.  SYMPTOMS TO REPORT IMMEDIATELY:   Following lower endoscopy (colonoscopy or flexible sigmoidoscopy):  Excessive amounts of blood in the stool  Significant tenderness or worsening of abdominal pains  Swelling of the abdomen that is new, acute  Fever of 100F or higher   For urgent or emergent issues, a gastroenterologist can be reached at any hour by calling [phone].   DIET:  We do recommend a small meal at first, but then you may proceed to your regular diet.  Drink plenty of fluids but you should avoid alcoholic beverages for 24 hours.  ACTIVITY:  You should  plan to take it easy for the rest of today and you should NOT DRIVE or use heavy machinery until tomorrow (because of the sedation medicines used during the test).    FOLLOW UP: Our staff will call the number listed on your records 48-72 hours following your procedure to check on you and address any questions or concerns that you may have regarding the information given to you following your procedure. If we do not reach you, we will leave a message.  We will attempt to reach you two times.  During this call, we will ask if you have developed any symptoms of COVID 19. If you develop any symptoms (ie: fever, flu-like symptoms, shortness of breath, cough etc.) before then, please call [phone].  If you test positive for Covid 19 in the 2 weeks post procedure, please call and report this information to [place].    If any biopsies were taken you will be contacted by phone or by letter within the next 1-3 weeks.  Please call us at [phone] if you have not heard about the biopsies in 3 weeks.    SIGNATURES/CONFIDENTIALITY: You and/or your care partner have signed paperwork which will be entered into your electronic medical record.  These signatures attest to the fact that that the information above on your After Visit Summary has been reviewed and is understood.  Full responsibility of the confidentiality of this discharge information lies with you and/or your care-partner.

## 2019-12-26 NOTE — Op Note (Signed)
[place] Patient Name: Paul Holland Procedure Date: 12/26/2019 9:04 AM MRN: [id number] Endoscopist: Justice Britain , MD Age: 49 Referring MD:  Date of Birth: [birth year] Gender: Male Account #: [id number] Procedure:                Colonoscopy Indications:              Colon cancer screening in patient at increased                            risk: Family history of 1st-degree relative with                            colon polyps before age 63 years, Incidental - Anal                            bleeding, Family History of Colon Cancer (Uncle) Medicines:                Monitored Anesthesia Care Procedure:                Pre-Anesthesia Assessment:                           - Prior to the procedure, a History and Physical                            was performed, and patient medications and                            allergies were reviewed. The patient's tolerance of                            previous anesthesia was also reviewed. The risks                            and benefits of the procedure and the sedation                            options and risks were discussed with the patient.                            All questions were answered, and informed consent                            was obtained. Prior Anticoagulants: The patient has                            taken no previous anticoagulant or antiplatelet                            agents. ASA Grade Assessment: II - A patient with                            mild systemic disease. After reviewing the risks  and benefits, the patient was deemed in                            satisfactory condition to undergo the procedure.                           After obtaining informed consent, the colonoscope                            was passed under direct vision. Throughout the                            procedure, the patient's blood pressure, pulse, and                            oxygen  saturations were monitored continuously. The                            Colonoscope was introduced through the anus and                            advanced to the 5 cm into the ileum. The                            colonoscopy was performed without difficulty. The                            patient tolerated the procedure. The quality of the                            bowel preparation was good. The terminal ileum,                            ileocecal valve, appendiceal orifice, and rectum                            were photographed. Scope In: 9:07:59 AM Scope Out: 9:29:36 AM Scope Withdrawal Time: 0 hours 16 minutes 27 seconds  Total Procedure Duration: 0 hours 21 minutes 37 seconds  Findings:                 The digital rectal exam findings include                            hemorrhoids. Pertinent negatives include no                            palpable rectal lesions.                           The terminal ileum and ileocecal valve appeared                            normal.  Three sessile polyps were found in the transverse                            colon (1) and cecum (2). The polyps were 3 to 5 mm                            in size. These polyps were removed with a cold                            snare. Resection and retrieval were complete.                           Normal mucosa was found in the entire colon                            otherwise.                           Non-bleeding non-thrombosed internal hemorrhoids                            were found during retroflexion, during perianal                            exam and during digital exam. The hemorrhoids were                            Grade II (internal hemorrhoids that prolapse but                            reduce spontaneously). Complications:            No immediate complications. Estimated Blood Loss:     Estimated blood loss was minimal. Impression:               - Hemorrhoids found on  digital rectal exam.                           - The examined portion of the ileum was normal.                           - Three, 3 to 5 mm polyps in the transverse colon                            and in the cecum, removed with a cold snare.                            Resected and retrieved.                           - Normal mucosa in the entire examined colon                            otherwise.                           -  Non-bleeding non-thrombosed internal hemorrhoids. Recommendation:           - The patient will be observed post-procedure,                            until all discharge criteria are met.                           - Discharge patient to home.                           - Patient has a contact number available for                            emergencies. The signs and symptoms of potential                            delayed complications were discussed with the                            patient. Return to normal activities tomorrow.                            Written discharge instructions were provided to the                            patient.                           - High fiber diet.                           - Use FiberCon 1 tablet PO daily.                           - Continue present medications.                           - Await pathology results.                           - Repeat colonoscopy 3/5/7 years for surveillance                            based on pathology results and adenomatous tissue.                           - The findings and recommendations were discussed                            with the patient. Justice Britain, MD 12/26/2019 9:37:04 AM

## 2019-12-29 MED ORDER — FLUTICASONE-SALMETEROL 250-50 MCG/DOSE IN AEPB: 1.0000 | INHALATION_SPRAY | Freq: Two times a day (BID) | RESPIRATORY_TRACT | 2 refills | Status: DC

## 2019-12-30 ENCOUNTER — Telehealth: Payer: Self-pay

## 2019-12-30 NOTE — Telephone Encounter (Signed)
  Follow up Call-  Call back number 12/26/2019  Post procedure Call Back phone  # [phone]  Permission to leave phone message Yes  Some recent data might be hidden     Patient questions:  Do you have a fever, pain , or abdominal swelling? No. Pain Score  0 *  Have you tolerated food without any problems? Yes.    Have you been able to return to your normal activities? Yes.    Do you have any questions about your discharge instructions: Diet   No. Medications  No. Follow up visit  No.  Do you have questions or concerns about your Care? No.  Actions: * If pain score is 4 or above: No action needed, pain <4.  1. Have you developed a fever since your procedure? no  2.   Have you had an respiratory symptoms (SOB or cough) since your procedure? no  3.   Have you tested positive for COVID 19 since your procedure no  4.   Have you had any family members/close contacts diagnosed with the COVID 19 since your procedure?  no   If yes to any of these questions please route to Joylene John, RN and Alphonsa Gin, Therapist, sports.

## 2019-12-30 NOTE — Telephone Encounter (Signed)
First attempt follow up call to pt, lm on vm 

## 2019-12-31 ENCOUNTER — Encounter: Payer: Self-pay | Admitting: Gastroenterology

## 2020-01-01 ENCOUNTER — Telehealth: Payer: Self-pay | Admitting: Internal Medicine

## 2020-01-01 NOTE — Telephone Encounter (Signed)
Patient called and stated the labs from his physical on 10-27-2023 should be coded as preventative instead of diagnostic.   Email sent to billing for review:  Per billing the Lipid Panel labs were coded as preventative.  The Metabolic Panel and Complete CBC labs were coded as diagnostic.  Is this correct or should all labs have been preventative?

## 2020-01-01 NOTE — Telephone Encounter (Signed)
Actually, for the most part, all labs have to have an appropriate diagnosis to warrant doing them (perhaps with the exception of screening glucose/cholesterol). Insurances will deny any lab charges that do not have the proper linked diagnosis. I have never heard of coding labs as preventative--though if that is what the insurance asks for, I have no objection

## 2020-01-06 NOTE — Telephone Encounter (Signed)
I think it is fine to repost the charges using the preventative code

## 2020-01-06 NOTE — Telephone Encounter (Signed)
The patient is saying that his insurance will not cover the labs because they were coded as diagnostic. Billing is saying that the labs would have needed to be coded as a screening for the insurance to cover the labs.

## 2020-01-12 NOTE — Telephone Encounter (Signed)
I spoke with Dawn in billing she stated the following:   "If Dr. Silvio Pate wants to go in and document that the labs should have been ordered under the well diagnosis, then we can change the labs, but I can not do it without this since the note does not state anything about the labs and the neuropathy dx is on the lab orders."   Do you want to go in and make the changes?

## 2020-01-12 NOTE — Telephone Encounter (Signed)
Okay I did make the changes on the visit per an addendum---so see if Dawn can now resubmit them

## 2020-01-20 MED ORDER — GABAPENTIN 300 MG PO CAPS: 300.0000 mg | ORAL_CAPSULE | Freq: Every day | ORAL | 3 refills | Status: DC

## 2020-01-20 MED ORDER — CYCLOBENZAPRINE HCL 5 MG PO TABS: ORAL_TABLET | ORAL | 3 refills | Status: DC

## 2020-02-06 NOTE — Telephone Encounter (Signed)
Corrections processed.

## 2020-03-05 ENCOUNTER — Ambulatory Visit: Payer: Managed Care, Other (non HMO) | Admitting: Neurology

## 2020-03-08 ENCOUNTER — Ambulatory Visit: Payer: Managed Care, Other (non HMO) | Admitting: Neurology

## 2020-03-12 ENCOUNTER — Ambulatory Visit: Payer: Managed Care, Other (non HMO) | Admitting: Neurology

## 2020-03-12 ENCOUNTER — Encounter: Payer: Self-pay | Admitting: Neurology

## 2020-03-12 ENCOUNTER — Other Ambulatory Visit: Payer: Self-pay

## 2020-03-12 VITALS — BP 112/68 | HR 62 | Resp 18 | Ht 70.0 in | Wt 206.0 lb

## 2020-03-12 DIAGNOSIS — R202 Paresthesia of skin: Secondary | ICD-10-CM | POA: Diagnosis not present

## 2020-03-12 NOTE — Patient Instructions (Signed)
Return to clinic to clinic February 2022.

## 2020-03-12 NOTE — Progress Notes (Signed)
Follow-up Visit   Date: 03/12/20    Paul Holland MRN: [id number] DOB: December 17, 1970   Interim History: Paul Holland is a 49 y.o. right-handed Caucasian male with asthma returning to the clinic for follow-up of low back pain and feet tingling.  The patient was accompanied to the clinic by self.  History of present illness: Starting around 2014, he developed low back pain, described as achy and localized over the midline. He develops tingling discomfort and sharp pain which starts in the back and radiates bilaterally down the lateral side of the leg, dorsum of the feet, and balls of the feet.   He started seeing a chiropractor who recommended low back exercises which helped his low back pain some, but did not alleviate any tingling.   Symptoms are worse sitting and improved by standing/walking.  Symptoms usually start with tingling and then evolves into sharp pain.  MRI lumbar spine shows no nerve impingement.  He went to physical therapy and appreciated marked benefit with low back pain with dry needling.  He takes gabapentin 35m daily for his feet tingling, which has also improved. NCS/EMG of the legs and neuropathy labs returned normal.  He continues to get dry needling about once per month which helps with low back pain.    UPDATE 03/12/2020:  He is here for follow-up visit.  He reports that the tingling in his feet is stable and well-controlled on gabapentin 3042mat bedtime.  There has been no progression and he does not have new complaints. He continues to get dry needling for his back, which is effective.  Over the past year, he has lost 47lb with low-carb diet and is hoping to loose 10lb more.    Medications:  Current Outpatient Medications on File Prior to Visit  Medication Sig Dispense Refill  . cyclobenzaprine (FLEXERIL) 5 MG tablet TAKE 1 TABLET BY MOUTH AT BEDTIME AS NEEDED FOR MUSCLE SPASM 90 tablet 3  . Fluticasone-Salmeterol (ADVAIR) 250-50 MCG/DOSE AEPB Inhale 1 puff  into the lungs 2 (two) times daily. 180 each 2  . gabapentin (NEURONTIN) 300 MG capsule Take 1 capsule (300 mg total) by mouth at bedtime. 90 capsule 3  . GARLIC PO Take 1 capsule by mouth daily.    . Marland Kitchenoratadine (CLARITIN) 10 MG tablet Take 10-20 mg by mouth daily.    . Multiple Vitamin (MULTI-VITAMINS) TABS Take 1 tablet by mouth daily.    . Omega-3 Fatty Acids (FISH OIL PO) Take by mouth.    . TURMERIC PO Take 1 capsule by mouth daily.     No current facility-administered medications on file prior to visit.    Allergies: No Known Allergies  Vital Signs:  BP 112/68   Pulse 62   Resp 18   Ht _0  (1.778 m)   Wt 206 lb (93.4 kg)   SpO2 98%   BMI 29.56 kg/m    Neurological Exam: MENTAL STATUS including orientation to time, place, person, recent and remote memory, attention span and concentration, language, and fund of knowledge is normal.  Speech is not dysarthric.  CRANIAL NERVES: No visual field defects. Extraocular muscles intact.   MOTOR:  Motor strength is 5/5 in all extremities, including distally.   No pronator drift.  Tone is normal.    MSRs:  Reflexes are 2+/4 throughout.  SENSORY:  Intact to vibration, pin prick, and temperature throughout.  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 No herniated disc, spinal stenosis, or foraminal stenosis. 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  Fortunately, there has been no progression and syymptoms are well-controlled on gabapentin 367m at bedtime which will be continued.  If symptoms get worse, consider skin biopsy.  Return to clinic in 1 year  Thank  you for allowing me to participate in patient's care.  If I can answer any additional questions, I would be pleased to do so.    Sincerely,    Dee Paden K. PPosey Pronto DO

## 2020-09-01 ENCOUNTER — Other Ambulatory Visit: Payer: Self-pay | Admitting: Internal Medicine

## 2020-09-20 ENCOUNTER — Other Ambulatory Visit: Payer: Self-pay

## 2020-09-20 ENCOUNTER — Encounter: Payer: Self-pay | Admitting: Internal Medicine

## 2020-09-20 ENCOUNTER — Telehealth: Payer: Self-pay

## 2020-09-20 ENCOUNTER — Ambulatory Visit: Payer: Managed Care, Other (non HMO) | Admitting: Internal Medicine

## 2020-09-20 ENCOUNTER — Ambulatory Visit (INDEPENDENT_AMBULATORY_CARE_PROVIDER_SITE_OTHER)
Admission: RE | Admit: 2020-09-20 | Discharge: 2020-09-20 | Disposition: A | Discharge status: Home / Self Care | Payer: Managed Care, Other (non HMO) | Source: Ambulatory Visit | Attending: Internal Medicine | Admitting: Internal Medicine

## 2020-09-20 DIAGNOSIS — R079 Chest pain, unspecified: Secondary | ICD-10-CM

## 2020-09-20 NOTE — Assessment & Plan Note (Addendum)
Initially started as pain under axilla and was most severe---and with dyspnea Then to left side, finally milder and across both sides Not particularly consistent with coronary ischemia No clear injury but could be muscular Could have pulmonary process or even PE at first---but this is also unlikely Some chance of pericarditis--but no acute illness, fever, etc Will check EKG, CXR and blood work  CXR looks normal---and heart is small EKG shows sinus at 72. Normal axis and intervals. No hypertrophy or ischemic changes. No comparison  Overall, no evidence of worrisome process. Will await the labs Will just monitor Most likely muscular

## 2020-09-20 NOTE — Telephone Encounter (Signed)
Yes---in office appropriate since I will want to do an EKG

## 2020-09-20 NOTE — Telephone Encounter (Signed)
Emily front office mgr said pt scheduled my chart appt for having some pains  Under arms and across chest periodically. I spoke with pt; pt having periodic CP and alternating under lt & rt arm pain for 1 1/2 wks; never had this pain before. pt said today pt has dull CP across the chest with pain level of 2. Sometimes pain is sharp and then the pain level is usually 7. No SOB,no radiation of pain to neck, jaw or arms and no N&V. No tightness or pressure in chest.Pt has no covid symptoms, and no known exposure to + covid. Pt has had both Moderna vaccines. Pt said he is in no distress now and does not want to go to UC or ED. Pt scheduled in office appt 10/21/20 at 3:15 with UC & ED precautions given and pt voiced understanding. FYI to Dr Silvio Pate.

## 2020-09-20 NOTE — Progress Notes (Signed)
Subjective:    Patient ID: Paul Holland, male    DOB: 24-Dec-1970, 49 y.o.   MRN: [id number]  HPI Here due to chest pain This visit occurred during the SARS-CoV-2 public health emergency.  Safety protocols were in place, including screening questions prior to the visit, additional usage of staff PPE, and extensive cleaning of exam room while observing appropriate contact time as indicated for disinfecting solutions.   Starting about 10 days ago--noticed pain under right arm (axilla) Would be sharp and briefly take his breath away Then he felt it in left axilla Later it was across the chest (without breathing symptoms) Now 3 days in a row---some pain across chest (sharp at times, pressing at others) Notices it sometimes with raising arms Pan is brief [place] Mylar was more mild but constant (while driving back from [place])  Does some lifting at work---doesn't remember anything different or apparent injury  Hasn't tried any meds for this  Current Outpatient Medications on File Prior to Visit  Medication Sig Dispense Refill  . ADVAIR DISKUS 250-50 MCG/DOSE AEPB USE 1 INHALATION TWICE A DAY 180 each 3  . cyclobenzaprine (FLEXERIL) 5 MG tablet TAKE 1 TABLET BY MOUTH AT BEDTIME AS NEEDED FOR MUSCLE SPASM 90 tablet 3  . gabapentin (NEURONTIN) 300 MG capsule Take 1 capsule (300 mg total) by mouth at bedtime. 90 capsule 3  . GARLIC PO Take 1 capsule by mouth daily.    Marland Kitchen loratadine (CLARITIN) 10 MG tablet Take 10-20 mg by mouth daily.    . Multiple Vitamin (MULTI-VITAMINS) TABS Take 1 tablet by mouth daily.    . Omega-3 Fatty Acids (FISH OIL PO) Take by mouth.    . TURMERIC PO Take 1 capsule by mouth daily.     No current facility-administered medications on file prior to visit.    No Known Allergies  Past Medical History:  Diagnosis Date  . Allergic rhinitis due to pollen   . Anxiety   . Asthma   . 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 Heart murmur    probably  mild MR. Sees Dr Nehemiah Massed  . Osteoarthritis of wrist     Past Surgical History:  Procedure Laterality Date  . Cartilage removed from ribs  1981  . KNEE ARTHROSCOPY WITH ANTERIOR CRUCIATE LIGAMENT (ACL) REPAIR Right 6/14 & 8/14   meniscus repair and tibial fracture repair (bone graft). ACL not repaired x 3    Family History  Problem Relation Age of Onset  . Hypertension Mother   . Diabetes Mother   . Hypertension Father   . Colon polyps Father   . Diabetes Brother   . Hyperlipidemia Brother   . Hypertension Brother   . Colon cancer Paternal Uncle   . Lung cancer Paternal Grandfather   . Heart disease Neg Hx   . Esophageal cancer Neg Hx   . Inflammatory bowel disease Neg Hx   . Liver disease Neg Hx   . Pancreatic cancer Neg Hx   . Rectal cancer Neg Hx   . Stomach cancer Neg Hx     Social History   Socioeconomic History  . Marital status: Married    Spouse name: Not on file  . Number of children: 3  . Years of education: 51  . Highest education level: Some college, no degree  Occupational History  . Occupation: Librarian, academic-- factory    Comment: Systems developer  Tobacco Use  . Smoking status: Former Smoker  Types: Cigarettes    Quit date: 11/04/2007    Years since quitting: 12.8  . Smokeless tobacco: Never Used  Vaping Use  . Vaping Use: Never used  Substance and Sexual Activity  . Alcohol use: Yes    Comment: Occasionally - sporadic  . Drug use: No  . Sexual activity: Not on file  Other Topics Concern  . Not on file  Social History Narrative   2nd marriage   3 children who live with their mother in [place]----regular visits to them.   Lives in a 2 story home.     Education: some college.  Works as a Librarian, academic for a [employer]        Social Determinants of Health   Financial Resource Strain:   . Difficulty of Paying Living Expenses: Not on file  Food Insecurity:   . Worried About Charity fundraiser in the Last Year: Not on file  . Ran Out  of Food in the Last Year: Not on file  Transportation Needs:   . Lack of Transportation (Medical): Not on file  . Lack of Transportation (Non-Medical): Not on file  Physical Activity:   . Days of Exercise per Week: Not on file  . Minutes of Exercise per Session: Not on file  Stress:   . Feeling of Stress : Not on file  Social Connections:   . Frequency of Communication with Friends and Family: Not on file  . Frequency of Social Gatherings with Friends and Family: Not on file  . Attends Religious Services: Not on file  . Active Member of Clubs or Organizations: Not on file  . Attends Archivist Meetings: Not on file  . Marital Status: Not on file  Intimate Partner Violence:   . Fear of Current or Ex-Partner: Not on file  . Emotionally Abused: Not on file  . Physically Abused: Not on file  . Sexually Abused: Not on file   Review of Systems No fever Occasional cough---nothing striking No consistent dyspnea Walks regularly and does stepper---no change in stamina/fitness    Objective:   Physical Exam Constitutional:      Appearance: Normal appearance.  Cardiovascular:     Rate and Rhythm: Normal rate and regular rhythm.     Pulses: Normal pulses.     Heart sounds: No murmur heard.  No gallop.   Pulmonary:     Effort: Pulmonary effort is normal. No respiratory distress.     Breath sounds: Normal breath sounds. No wheezing or rales.  Chest:     Chest wall: No tenderness.  Abdominal:     Palpations: Abdomen is soft.     Tenderness: There is no abdominal tenderness.  Musculoskeletal:     Cervical back: Neck supple.     Right lower leg: No edema.     Left lower leg: No edema.  Lymphadenopathy:     Cervical: No cervical adenopathy.  Neurological:     Mental Status: He is alert.  Psychiatric:        Mood and Affect: Mood normal.        Behavior: Behavior normal.            Assessment & Plan:

## 2020-09-21 LAB — CBC
HCT: 47 % (ref 39.0–52.0)
Hemoglobin: 16.3 g/dL (ref 13.0–17.0)
MCHC: 34.7 g/dL (ref 30.0–36.0)
MCV: 95 fl (ref 78.0–100.0)
Platelets: 261 10*3/uL (ref 150.0–400.0)
RBC: 4.95 Mil/uL (ref 4.22–5.81)
RDW: 13.1 % (ref 11.5–15.5)
WBC: 6.9 10*3/uL (ref 4.0–10.5)

## 2020-09-21 LAB — COMPREHENSIVE METABOLIC PANEL
ALT: 18 U/L (ref 0–53)
AST: 10 U/L (ref 0–37)
Albumin: 4.7 g/dL (ref 3.5–5.2)
Alkaline Phosphatase: 72 U/L (ref 39–117)
BUN: 12 mg/dL (ref 6–23)
CO2: 32 mEq/L (ref 19–32)
Calcium: 9 mg/dL (ref 8.4–10.5)
Chloride: 100 mEq/L (ref 96–112)
Creatinine, Ser: 0.97 mg/dL (ref 0.40–1.50)
GFR: 90.69 mL/min (ref >60.00)
Glucose, Bld: 87 mg/dL (ref 70–99)
Potassium: 4.1 mEq/L (ref 3.5–5.1)
Sodium: 140 mEq/L (ref 135–145)
Total Bilirubin: 0.6 mg/dL (ref 0.2–1.2)
Total Protein: 7.3 g/dL (ref 6.0–8.3)

## 2020-09-21 LAB — T4, FREE: Free T4: 0.63 ng/dL (ref 0.60–1.60)

## 2020-09-21 LAB — SEDIMENTATION RATE: Sed Rate: 3 mm/hr (ref 0–15)

## 2020-10-04 ENCOUNTER — Ambulatory Visit: Payer: Managed Care, Other (non HMO) | Admitting: Internal Medicine

## 2020-11-01 ENCOUNTER — Encounter: Payer: Self-pay | Admitting: Internal Medicine

## 2020-11-01 ENCOUNTER — Ambulatory Visit (INDEPENDENT_AMBULATORY_CARE_PROVIDER_SITE_OTHER): Payer: Managed Care, Other (non HMO) | Admitting: Internal Medicine

## 2020-11-01 ENCOUNTER — Other Ambulatory Visit: Payer: Self-pay

## 2020-11-01 VITALS — BP 110/74 | HR 82 | Temp 97.7°F | Ht 70.25 in | Wt 220.0 lb

## 2020-11-01 DIAGNOSIS — J683 Other acute and subacute respiratory conditions due to chemicals, gases, fumes and vapors: Secondary | ICD-10-CM

## 2020-11-01 DIAGNOSIS — Z0000 Encounter for general adult medical examination without abnormal findings: Secondary | ICD-10-CM

## 2020-11-01 DIAGNOSIS — G629 Polyneuropathy, unspecified: Secondary | ICD-10-CM

## 2020-11-01 NOTE — Assessment & Plan Note (Signed)
Healthy Discussed getting rededicated to healthy lifestyle He will look into insurance with shingrix Colon due again 2024 Defer PSA till 54

## 2020-11-01 NOTE — Assessment & Plan Note (Signed)
Doing fine with advair bid

## 2020-11-01 NOTE — Progress Notes (Signed)
Subjective:    Patient ID: Paul Holland, male    DOB: July 09, 1971, 49 y.o.   MRN: [id number]  HPI Here for physical This visit occurred during the SARS-CoV-2 public health emergency.  Safety protocols were in place, including screening questions prior to the visit, additional usage of staff PPE, and extensive cleaning of exam room while observing appropriate contact time as indicated for disinfecting solutions.   His dad is on Rx for BPH---no cancer Wonders about screening for this No trouble voiding  Having some left arm pain Can't throw with it Pain with abduction and internal rotation No known injury Has tried some tylenol--it helps Just about a week or so (after throwing toy to dog)  Current Outpatient Medications on File Prior to Visit  Medication Sig Dispense Refill  . ADVAIR DISKUS 250-50 MCG/DOSE AEPB USE 1 INHALATION TWICE A DAY 180 each 3  . cyclobenzaprine (FLEXERIL) 5 MG tablet TAKE 1 TABLET BY MOUTH AT BEDTIME AS NEEDED FOR MUSCLE SPASM 90 tablet 3  . gabapentin (NEURONTIN) 300 MG capsule Take 1 capsule (300 mg total) by mouth at bedtime. 90 capsule 3  . GARLIC PO Take 1 capsule by mouth daily.    Marland Kitchen loratadine (CLARITIN) 10 MG tablet Take 10-20 mg by mouth daily.    . Multiple Vitamin (MULTI-VITAMINS) TABS Take 1 tablet by mouth daily.    . Omega-3 Fatty Acids (FISH OIL PO) Take by mouth.    . TURMERIC PO Take 1 capsule by mouth daily.     No current facility-administered medications on file prior to visit.    No Known Allergies  Past Medical History:  Diagnosis Date  . Allergic rhinitis due to pollen   . Anxiety   . Asthma   . 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 Heart murmur    probably mild MR. Sees Dr Nehemiah Massed  . Osteoarthritis of wrist     Past Surgical History:  Procedure Laterality Date  . Cartilage removed from ribs  1981  . KNEE ARTHROSCOPY WITH ANTERIOR CRUCIATE LIGAMENT (ACL) REPAIR Right 6/14 & 8/14   meniscus  repair and tibial fracture repair (bone graft). ACL not repaired x 3    Family History  Problem Relation Age of Onset  . Hypertension Mother   . Diabetes Mother   . Hypertension Father   . Colon polyps Father   . Diabetes Brother   . Hyperlipidemia Brother   . Hypertension Brother   . Colon cancer Paternal Uncle   . Lung cancer Paternal Grandfather   . Heart disease Neg Hx   . Esophageal cancer Neg Hx   . Inflammatory bowel disease Neg Hx   . Liver disease Neg Hx   . Pancreatic cancer Neg Hx   . Rectal cancer Neg Hx   . Stomach cancer Neg Hx     Social History   Socioeconomic History  . Marital status: Married    Spouse name: Not on file  . Number of children: 3  . Years of education: 47  . Highest education level: Some college, no degree  Occupational History  . Occupation: Librarian, academic-- factory    Comment: Systems developer  Tobacco Use  . Smoking status: Former Smoker    Types: Cigarettes    Quit date: 11/04/2007    Years since quitting: 13.0  . Smokeless tobacco: Never Used  Vaping Use  . Vaping Use: Never used  Substance and Sexual Activity  . Alcohol use: Yes  Comment: Occasionally - sporadic  . Drug use: No  . Sexual activity: Not on file  Other Topics Concern  . Not on file  Social History Narrative   2nd marriage   3 children who live with their mother in [place]----regular visits to them.   Lives in a 2 story home.     Education: some college.  Works as a Librarian, academic for a [employer]        Social Determinants of Health   Financial Resource Strain:   . Difficulty of Paying Living Expenses: Not on file  Food Insecurity:   . Worried About Charity fundraiser in the Last Year: Not on file  . Ran Out of Food in the Last Year: Not on file  Transportation Needs:   . Lack of Transportation (Medical): Not on file  . Lack of Transportation (Non-Medical): Not on file  Physical Activity:   . Days of Exercise per Week: Not on file  . Minutes of  Exercise per Session: Not on file  Stress:   . Feeling of Stress : Not on file  Social Connections:   . Frequency of Communication with Friends and Family: Not on file  . Frequency of Social Gatherings with Friends and Family: Not on file  . Attends Religious Services: Not on file  . Active Member of Clubs or Organizations: Not on file  . Attends Archivist Meetings: Not on file  . Marital Status: Not on file  Intimate Partner Violence:   . Fear of Current or Ex-Partner: Not on file  . Emotionally Abused: Not on file  . Physically Abused: Not on file  . Sexually Abused: Not on file   Review of Systems  Constitutional:       Weight has been drifting up---working on lifestyle again Wears seat belt Exercises--but not as much lately  HENT: Negative for dental problem, hearing loss and trouble swallowing.        Occ tinnitus---hearing okay Keeps up with dentist  Eyes: Negative for visual disturbance.       No diplopia or unilateral vision loss  Respiratory: Negative for cough, chest tightness and shortness of breath.   Cardiovascular: Negative for chest pain, palpitations and leg swelling.  Gastrointestinal: Negative for blood in stool and constipation.       Rare heartburn---tums will relieve  Endocrine: Negative for polydipsia and polyuria.  Genitourinary: Negative for difficulty urinating and urgency.       No sexual problems  Musculoskeletal: Positive for arthralgias. Negative for joint swelling.       Ongoing back pain  Skin: Negative for rash.       Keeps up with derm  Allergic/Immunologic: Positive for environmental allergies. Negative for immunocompromised state.       Meds do help  Neurological: Negative for dizziness, syncope and light-headedness.       Occ stress headaches Has tingling---gabapentin at night helps  Hematological: Negative for adenopathy. Does not bruise/bleed easily.  Psychiatric/Behavioral: Negative for dysphoric mood and sleep disturbance.         Some anxiety at times---does keep up with psychologist monthly       Objective:   Physical Exam Constitutional:      Appearance: Normal appearance.  HENT:     Right Ear: Tympanic membrane, ear canal and external ear normal.     Left Ear: Tympanic membrane, ear canal and external ear normal.     Mouth/Throat:     Pharynx: No oropharyngeal exudate or  posterior oropharyngeal erythema.  Eyes:     Conjunctiva/sclera: Conjunctivae normal.     Pupils: Pupils are equal, round, and reactive to light.  Cardiovascular:     Rate and Rhythm: Normal rate and regular rhythm.     Pulses: Normal pulses.     Heart sounds: No murmur heard.  No gallop.   Pulmonary:     Effort: Pulmonary effort is normal.     Breath sounds: Normal breath sounds. No wheezing or rales.  Abdominal:     Palpations: Abdomen is soft.     Tenderness: There is no abdominal tenderness.  Musculoskeletal:     Cervical back: Neck supple.     Right lower leg: No edema.     Left lower leg: No edema.     Comments: Fairly normal ROM in left shoulder/no tenderness (discussed supportive care for strain)  Lymphadenopathy:     Cervical: No cervical adenopathy.  Skin:    General: Skin is warm.     Findings: No rash.     Comments: Multiple benign nevi  Neurological:     General: No focal deficit present.     Mental Status: He is alert and oriented to person, place, and time.  Psychiatric:        Mood and Affect: Mood normal.        Behavior: Behavior normal.            Assessment & Plan:

## 2020-11-01 NOTE — Assessment & Plan Note (Signed)
Technically not neuropathy but does well with nightly gabapentin

## 2021-01-14 ENCOUNTER — Encounter: Payer: Self-pay | Admitting: Neurology

## 2021-01-14 ENCOUNTER — Other Ambulatory Visit: Payer: Self-pay

## 2021-01-14 ENCOUNTER — Ambulatory Visit: Payer: Managed Care, Other (non HMO) | Admitting: Neurology

## 2021-01-14 VITALS — BP 118/76 | HR 67 | Ht 70.0 in | Wt 227.0 lb

## 2021-01-14 DIAGNOSIS — R202 Paresthesia of skin: Secondary | ICD-10-CM | POA: Diagnosis not present

## 2021-01-14 MED ORDER — GABAPENTIN 300 MG PO CAPS
300.0000 mg | ORAL_CAPSULE | Freq: Every day | ORAL | 3 refills | Status: DC
Start: 2021-01-14 — End: 2022-01-16

## 2021-01-14 NOTE — Progress Notes (Signed)
    Follow-up Visit   Date: 01/14/21    Paul Holland MRN: [id number] DOB: 01-27-1971   Interim History: Paul Holland is a 50 y.o. right-handed Caucasian male with asthma returning to the clinic for follow-up of low back pain and feet tingling.  The patient was accompanied to the clinic by self.  There has been no change over the past year with respect to the tingling in his feet.  He remains over the soles and balls of the feet and is very well-controlled on gabapentin $RemoveBefor'300mg'XipoicKFpOLG$  at bedtime.  He denies weakness, balance, or falls.     Medications:  Current Outpatient Medications on File Prior to Visit  Medication Sig Dispense Refill  . ADVAIR DISKUS 250-50 MCG/DOSE AEPB USE 1 INHALATION TWICE A DAY 180 each 3  . cyclobenzaprine (FLEXERIL) 5 MG tablet TAKE 1 TABLET BY MOUTH AT BEDTIME AS NEEDED FOR MUSCLE SPASM 90 tablet 3  . gabapentin (NEURONTIN) 300 MG capsule Take 1 capsule (300 mg total) by mouth at bedtime. 90 capsule 3  . GARLIC PO Take 1 capsule by mouth daily.    Marland Kitchen loratadine (CLARITIN) 10 MG tablet Take 10-20 mg by mouth daily.    . Multiple Vitamin (MULTI-VITAMINS) TABS Take 1 tablet by mouth daily.    . Omega-3 Fatty Acids (FISH OIL PO) Take by mouth.    . TURMERIC PO Take 1 capsule by mouth daily.     No current facility-administered medications on file prior to visit.    Allergies: No Known Allergies  Vital Signs:  BP 118/76   Pulse 67   Ht $R'5\' 10"'wh$  (1.778 m)   Wt 227 lb (103 kg)   SpO2 98%   BMI 32.57 kg/m    Neurological Exam: MENTAL STATUS including orientation to time, place, person, recent and remote memory, attention span and concentration, language, and fund of knowledge is normal.  Speech is not dysarthric.  CRANIAL NERVES:  Extraocular muscles intact.   MOTOR:  Motor strength is 5/5 in all extremities, including distally.   No pronator drift.  Tone is normal.    MSRs:  Reflexes are 2+/4 throughout.  SENSORY:  Intact to vibration, pin prick,  and temperature throughout.  COORDINATION/GAIT: Gait is normal, stressed and tandem gait intact  Data: MRI lumbar spine without contrast 12/18/2017: Mild degenerative disc disease and facet joint arthritis. No herniated disc, spinal stenosis, or foraminal stenosis. NCS/EMG of the legs 04/08/2018:  Normal  Labs 04/03/2018:  TSH 1.63, copper 103, vitamin 9, MMA 123, folate 22, ESR 6   IMPRESSION/PLAN: Bilateral feet paresthesias, probable early neuropathy, although NCS/EMG and MRI lumbar spine has been normal.  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  His symptoms have been stable without progression for the past several years.  I will keep him on gabapentin $RemoveBefor'300mg'eDUwscrHFtuw$  at bedtime, which provides adequate pain relief.   Return to clinic in 1 year  Thank you for allowing me to participate in patient's care.  If I can answer any additional questions, I would be pleased to do so.    Sincerely,    Donika K. Posey Pronto, DO

## 2021-01-14 NOTE — Patient Instructions (Signed)
Continue to gabapentin 300mg  at bedtime  Return to clinic in 1 year

## 2021-01-20 ENCOUNTER — Other Ambulatory Visit: Payer: Self-pay | Admitting: Neurology

## 2021-01-24 ENCOUNTER — Telehealth: Payer: Self-pay

## 2021-01-24 MED ORDER — CYCLOBENZAPRINE HCL 5 MG PO TABS: ORAL_TABLET | ORAL | 3 refills | Status: DC

## 2021-01-24 NOTE — Telephone Encounter (Signed)
Per Dr. Posey Pronto. 1 year supply ok to fill

## 2021-07-21 ENCOUNTER — Ambulatory Visit: Payer: Managed Care, Other (non HMO) | Admitting: Family Medicine

## 2021-07-21 ENCOUNTER — Encounter: Payer: Self-pay | Admitting: Family Medicine

## 2021-07-21 ENCOUNTER — Other Ambulatory Visit: Payer: Self-pay

## 2021-07-21 VITALS — BP 108/70 | HR 64 | Temp 98.0°F | Wt 235.2 lb

## 2021-07-21 DIAGNOSIS — M25561 Pain in right knee: Secondary | ICD-10-CM | POA: Insufficient documentation

## 2021-07-21 NOTE — Progress Notes (Signed)
Subjective:     Paul Holland is a 50 y.o. male presenting for Knee Pain (And swelling in R X 1 week. Has previous surgery 8 years ago. )     Knee Pain  Pertinent negatives include no numbness.   #Right knee pain - hx of fracture, ACL tear, and meniscus injury 8 years ago s/p partial repair - chronic knee pain which has not been severe since then - last week was working - digging - took it easy for 4 days  and was getting better - did get back on the treadmill and then it started getting tighter and sore again - did have a click for a few days and that has improved overtime  - no buckling or instability - tight sensation - worse with flexion and straightening - swelling in the knee   Review of Systems  Constitutional:  Negative for chills and fever.  Neurological:  Negative for numbness.    Social History   Tobacco Use  Smoking Status Former   Types: Cigarettes   Quit date: 11/04/2007   Years since quitting: 13.7  Smokeless Tobacco Never        Objective:    BP Readings from Last 3 Encounters:  07/21/21 108/70  01/14/21 118/76  11/01/20 110/74   Wt Readings from Last 3 Encounters:  07/21/21 235 lb 4 oz (106.7 kg)  01/14/21 227 lb (103 kg)  11/01/20 220 lb (99.8 kg)    BP 108/70   Pulse 64   Temp 98 F (36.7 C) (Temporal)   Wt 235 lb 4 oz (106.7 kg)   SpO2 97%   BMI 33.75 kg/m    Physical Exam Constitutional:      Appearance: Normal appearance. He is not ill-appearing or diaphoretic.  HENT:     Right Ear: External ear normal.     Left Ear: External ear normal.  Eyes:     General: No scleral icterus.    Extraocular Movements: Extraocular movements intact.     Conjunctiva/sclera: Conjunctivae normal.  Cardiovascular:     Rate and Rhythm: Normal rate.  Pulmonary:     Effort: Pulmonary effort is normal.  Musculoskeletal:     Cervical back: Neck supple.     Comments: Right knee Inspection: slight swelling, no erythema Palpation: no joint  line or patella or bone ttp ROM: loss of 10 degrees of extension and 15 degrees of flexion compared to the left Strength: normal Ligaments: intact Grind test negative   Skin:    General: Skin is warm and dry.  Neurological:     Mental Status: He is alert. Mental status is at baseline.  Psychiatric:        Mood and Affect: Mood normal.          Assessment & Plan:   Problem List Items Addressed This Visit       Other   Acute pain of right knee - Primary    Pt with hx of trauma to right knee and now with new onset right knee pain. Exam with loss of ROM and small effusion. Advised NSAIDs, ice, rest and if rom not improving to return to see Dr. Lorelei Pont with sports medicine.         Return in about 1 week (around 07/28/2021), or if symptoms worsen or fail to improve, for with [place].  Lesleigh Noe, MD  This visit occurred during the SARS-CoV-2 public health emergency.  Safety protocols were in place, including screening questions prior  to the visit, additional usage of staff PPE, and extensive cleaning of exam room while observing appropriate contact time as indicated for disinfecting solutions.

## 2021-07-21 NOTE — Patient Instructions (Addendum)
Knee - Ibuprofen 600-800 mg twice to three times daily - continue to ice as needed - rest for 1 week  - if range of motion has not returned to baseline in 1 week make follow-up visit with Dr. Lorelei Pont -- message about imaging if this is needed for this appointment

## 2021-07-21 NOTE — Assessment & Plan Note (Signed)
Pt with hx of trauma to right knee and now with new onset right knee pain. Exam with loss of ROM and small effusion. Advised NSAIDs, ice, rest and if rom not improving to return to see Dr. Lorelei Pont with sports medicine.

## 2021-07-27 ENCOUNTER — Ambulatory Visit
Admission: RE | Admit: 2021-07-27 | Discharge: 2021-07-27 | Disposition: A | Discharge status: Home / Self Care | Payer: Managed Care, Other (non HMO) | Attending: Family Medicine | Admitting: Family Medicine

## 2021-07-27 ENCOUNTER — Encounter: Payer: Self-pay | Admitting: Family Medicine

## 2021-07-27 ENCOUNTER — Ambulatory Visit
Admission: RE | Admit: 2021-07-27 | Discharge: 2021-07-27 | Disposition: A | Discharge status: Home / Self Care | Payer: Managed Care, Other (non HMO) | Source: Ambulatory Visit | Attending: Family Medicine | Admitting: Family Medicine

## 2021-07-27 DIAGNOSIS — M25561 Pain in right knee: Secondary | ICD-10-CM

## 2021-07-27 NOTE — Telephone Encounter (Signed)
Completely agree, and I will see him at his appointment.

## 2021-07-28 ENCOUNTER — Ambulatory Visit: Payer: Managed Care, Other (non HMO) | Admitting: Family Medicine

## 2021-07-28 ENCOUNTER — Encounter: Payer: Self-pay | Admitting: Family Medicine

## 2021-07-28 ENCOUNTER — Other Ambulatory Visit: Payer: Self-pay

## 2021-07-28 VITALS — BP 110/70 | HR 79 | Temp 98.3°F | Ht 70.0 in | Wt 238.2 lb

## 2021-07-28 DIAGNOSIS — M238X1 Other internal derangements of right knee: Secondary | ICD-10-CM

## 2021-07-28 DIAGNOSIS — M1731 Unilateral post-traumatic osteoarthritis, right knee: Secondary | ICD-10-CM

## 2021-07-28 DIAGNOSIS — M2341 Loose body in knee, right knee: Secondary | ICD-10-CM

## 2021-07-28 NOTE — Progress Notes (Signed)
Paul Rylander T. Memori Sammon, MD, [place] at [facility] [place], [address]  Phone: [phone]  FAX: [place] - 50 y.o. male  MRN [id number]  Date of Birth: 12-May-1971  Date: 07/28/2021  PCP: Venia Carbon, MD  Referral: Venia Carbon, MD  Chief Complaint  Patient presents with   Knee Pain    Right-Had X-rays done last night    This visit occurred during the SARS-CoV-2 public health emergency.  Safety protocols were in place, including screening questions prior to the visit, additional usage of staff PPE, and extensive cleaning of exam room while observing appropriate contact time as indicated for disinfecting solutions.   Subjective:   OMRI CAPONI is a 50 y.o. very pleasant male patient with Body mass index is 34.19 kg/m. who presents with the following:  Dr. Einar Pheasant, consult for R knee pain.  Shattered lateral aspect of the knee many years ago.  He also has had a distant ACL rupture without reconstruction.  This is all on the right knee.  I did independently reviewed his x-rays, and we discussed while he was in the office.  He does notably have some obvious history of prior traumatic injury on lateral aspect of the tibial plateau.  Overall, he does have moderate degenerative joint disease of the knee.  I think that he has a lateral loose body and there is also a distinct loose body versus posttraumatic osteophyte formation at the lateral tibial plateau.  He will have intermittent pivoting shifting in the knee. Now he is having a mechanical click.  These things will cause some symptoms and pain: With lifting on it Walking really fast Will get a click in his joint know  Feels really tight.  Across the whole joint  Previously 90-95 better.   Last week, he was shovelling  and bracing the back leg, and he felt something go and had pain.  Treadmill did hurt.  Icing almost  nightly.    Review of Systems is noted in the HPI, as appropriate   Objective:   BP 110/70   Pulse 79   Temp 98.3 F (36.8 C) (Temporal)   Ht '5\' 10"'$  (1.778 m)   Wt 238 lb 4 oz (108.1 kg)   SpO2 97%   BMI 34.19 kg/m    Knee:  R Gait: Normal heel toe pattern ROM: He lacks 3 degrees of extension and flexion to 100. Effusion: Mild Echymosis or edema: none Patellar tendon NT Painful PLICA: neg Patellar grind: negative Medial and lateral patellar facet loading: negative medial and lateral joint lines: Medial greater than lateral Mcmurray's neg Flexion-pinch neg Varus and valgus stress: stable Lachman: Positive  ant and Post drawer: Anterior drawer testing is positive Hip abduction, IR, ER: WNL Hip flexion str: 5/5 Hip abd: 5/5 Quad: 5/5 VMO atrophy:No Hamstring concentric and eccentric: 5/5   Radiology: DG Knee Complete 4 Views Right  Result Date: 07/28/2021 CLINICAL DATA:  Acute knee pain EXAM: RIGHT KNEE - COMPLETE 4+ VIEW COMPARISON:  None. FINDINGS: No acute fracture or dislocation. Mild-to-moderate tricompartmental degenerative changes consisting of diffuse mild joint space loss and moderate osteophyte formation of the lateral and patellofemoral compartments. Small joint effusion. IMPRESSION: Mild-to-moderate tricompartmental degenerative changes. Small joint effusion. Electronically Signed   By: Yetta Glassman M.D.   On: 07/28/2021 10:44     Assessment and Plan:     ICD-10-CM   1. Post-traumatic osteoarthritis of right knee  M17.31     2. Loose body in knee, right knee  M23.41     3. 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  At the same time, he also developed ACL rupture.  Obvious posttraumatic changes, and he has moderate arthritic changes on my review.  I also do see of loose body, and I think that this is the most  likely cause of his intermittent mechanical symptoms.  Possible other internal derangement would be possible, but in obvious causes the loose body, but there also may be an additional loose body but it is adjacent to the lateral tibial plateau.  Right now he is getting better, so continue with conservative measures.  Social: Right now he does have some functional disability of the knee, and with mechanical symptoms and recent pivot shift of the knee, he is not at 100% from a physical activity standpoint.  I appreciate the opportunity to evaluate this very friendly patient. If you have any question regarding his care or prognosis, do not hesitate to ask.   Patient Instructions  You should be able to take generic ibuprofen orally. (Over the counter Motrin, Advil, or Generic Ibuprofen 200 mg tablets. 3-4 tablets by mouth 3 times a day. This equals a prescription strength dose.)   Work on your range of motion several times a day.   Follow-up: As needed if no improvement  Dr[employer] One speech-to-text software was used for transcription in this dictation.  Possible transcriptional errors can occur using Editor, commissioning.   Signed,  Maud Deed. Zahlia Deshazer, MD   Outpatient Encounter Medications as of 07/28/2021  Medication Sig   ADVAIR DISKUS 250-50 MCG/DOSE AEPB USE 1 INHALATION TWICE A DAY   cyclobenzaprine (FLEXERIL) 5 MG tablet TAKE 1 TABLET BY MOUTH AT BEDTIME AS NEEDED FOR MUSCLE SPASM   gabapentin (NEURONTIN) 300 MG capsule Take 1 capsule (300 mg total) by mouth at bedtime.   GARLIC PO Take 1 capsule by mouth daily.   loratadine (CLARITIN) 10 MG tablet Take 10-20 mg by mouth daily.   Multiple Vitamin (MULTI-VITAMINS) TABS Take 1 tablet by mouth daily.   Omega-3 Fatty Acids (FISH OIL PO) Take by mouth.   TURMERIC PO Take 1 capsule by mouth daily.   No facility-administered encounter medications on file as of 07/28/2021.

## 2021-07-28 NOTE — Patient Instructions (Signed)
You should be able to take generic ibuprofen orally. (Over the counter Motrin, Advil, or Generic Ibuprofen 200 mg tablets. 3-4 tablets by mouth 3 times a day. This equals a prescription strength dose.)   Work on your range of motion several times a day.

## 2021-08-01 ENCOUNTER — Ambulatory Visit: Payer: Managed Care, Other (non HMO) | Admitting: Family Medicine

## 2021-09-26 ENCOUNTER — Other Ambulatory Visit: Payer: Self-pay | Admitting: Internal Medicine

## 2021-11-03 ENCOUNTER — Ambulatory Visit (INDEPENDENT_AMBULATORY_CARE_PROVIDER_SITE_OTHER): Payer: Managed Care, Other (non HMO) | Admitting: Internal Medicine

## 2021-11-03 ENCOUNTER — Other Ambulatory Visit: Payer: Self-pay

## 2021-11-03 ENCOUNTER — Encounter: Payer: Self-pay | Admitting: Internal Medicine

## 2021-11-03 VITALS — BP 118/78 | HR 78 | Temp 97.9°F | Ht 70.0 in | Wt 232.0 lb

## 2021-11-03 DIAGNOSIS — G629 Polyneuropathy, unspecified: Secondary | ICD-10-CM | POA: Diagnosis not present

## 2021-11-03 DIAGNOSIS — J683 Other acute and subacute respiratory conditions due to chemicals, gases, fumes and vapors: Secondary | ICD-10-CM

## 2021-11-03 DIAGNOSIS — Z0000 Encounter for general adult medical examination without abnormal findings: Secondary | ICD-10-CM | POA: Diagnosis not present

## 2021-11-03 DIAGNOSIS — Z125 Encounter for screening for malignant neoplasm of prostate: Secondary | ICD-10-CM | POA: Diagnosis not present

## 2021-11-03 NOTE — Progress Notes (Signed)
Subjective:    Patient ID: Paul Holland, male    DOB: [birth year], 50 y.o.   MRN: [id number]  HPI Here for physical This visit occurred during the SARS-CoV-2 public health emergency.  Safety protocols were in place, including screening questions prior to the visit, additional usage of staff PPE, and extensive cleaning of exam room while observing appropriate contact time as indicated for disinfecting solutions.   He is still worried about his prostate No trouble voiding ---just worries about it Wants PSA done  Right knee is better Got cortisone injection which helped Not quite back to his exercise routine  Current Outpatient Medications on File Prior to Visit  Medication Sig Dispense Refill   ADVAIR DISKUS 250-50 MCG/ACT AEPB USE 1 INHALATION TWICE A DAY 180 each 3   Ascorbic Acid (VITAMIN C PO) Take by mouth.     cyclobenzaprine (FLEXERIL) 5 MG tablet TAKE 1 TABLET BY MOUTH AT BEDTIME AS NEEDED FOR MUSCLE SPASM 90 tablet 3   gabapentin (NEURONTIN) 300 MG capsule Take 1 capsule (300 mg total) by mouth at bedtime. 90 capsule 3   GARLIC PO Take 1 capsule by mouth daily.     loratadine (CLARITIN) 10 MG tablet Take 10-20 mg by mouth daily.     Multiple Vitamin (MULTI-VITAMINS) TABS Take 1 tablet by mouth daily.     Omega-3 Fatty Acids (FISH OIL PO) Take by mouth.     TURMERIC PO Take 1 capsule by mouth daily.     VITAMIN D, CHOLECALCIFEROL, PO Take by mouth.     No current facility-administered medications on file prior to visit.    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 Sees Dr Nehemiah Massed   Osteoarthritis of wrist     Past Surgical History:  Procedure Laterality Date   Cartilage removed from ribs  1981   KNEE ARTHROSCOPY WITH ANTERIOR CRUCIATE LIGAMENT (ACL) REPAIR Right 6/14 & 8/14   meniscus repair  and tibial fracture repair (bone graft). ACL not repaired x 3    Family History  Problem Relation Age of Onset   Hypertension Mother    Diabetes Mother    Hypertension Father    Colon polyps Father    Diabetes Brother    Hyperlipidemia Brother    Hypertension Brother    Colon cancer Paternal Uncle    Lung cancer Paternal Grandfather    Heart disease Neg Hx    Esophageal cancer Neg Hx    Inflammatory bowel disease Neg Hx    Liver disease Neg Hx    Pancreatic cancer Neg Hx    Rectal cancer Neg Hx    Stomach cancer Neg Hx     Social History   Socioeconomic History   Marital status: Married    Spouse name: Not on file   Number of children: 3   Years of education: 14   Highest education level: Some college, no degree  Occupational History   Occupation: Librarian, academic-- factory    Comment: Systems developer  Tobacco Use   Smoking status: Former    Types: Cigarettes    Quit date: 11/04/2007    Years since quitting: 14.0   Smokeless tobacco: Never  Vaping Use   Vaping Use: Never used  Substance and Sexual Activity  Alcohol use: Yes    Comment: Occasionally - sporadic   Drug use: No   Sexual activity: Not on file  Other Topics Concern   Not on file  Social History Narrative   2nd marriage   3 children who live with their mother in [place]----regular visits to them.   Lives in a 2 story home.     Education: some college.  Works as a Librarian, academic for a [employer]     Left Handed   Drinks Caffeine    Social Determinants of Health   Financial Resource Strain: Not on file  Food Insecurity: Not on file  Transportation Needs: Not on file  Physical Activity: Not on file  Stress: Not on file  Social Connections: Not on file  Intimate Partner Violence: Not on file   Review of Systems  Constitutional:  Positive for unexpected weight change. Negative for fatigue.       Weight up--he relates to more travel this year Wears seat belt  HENT:  Positive for tinnitus.  Negative for dental problem and hearing loss.        Keeps up with dentist  Eyes:  Negative for visual disturbance.       No diplopia or unilateral vision loss  Respiratory:  Negative for cough, chest tightness, shortness of breath and wheezing.   Cardiovascular:  Negative for chest pain, palpitations and leg swelling.  Gastrointestinal:  Negative for constipation.       Occ BRB in bowl and toilet paper No heartburn  Endocrine: Negative for polydipsia and polyuria.  Genitourinary:  Negative for difficulty urinating and urgency.       No sexual problems  Musculoskeletal:  Positive for arthralgias and back pain. Negative for joint swelling.  Skin:  Negative for rash.       See derm yearly  Allergic/Immunologic: Positive for environmental allergies. Negative for immunocompromised state.       Loratadine helps  Neurological:  Negative for dizziness, syncope and light-headedness.       Occ mild headaches  Hematological:  Negative for adenopathy. Does not bruise/bleed easily.  Psychiatric/Behavioral:  Negative for dysphoric mood and sleep disturbance. The patient is not nervous/anxious.        Speaks to therapist once a Jaynie Collins. This helps his mood      Objective:   Physical Exam Constitutional:      Appearance: Normal appearance.  HENT:     Right Ear: Tympanic membrane and ear canal normal.     Left Ear: Tympanic membrane and ear canal normal.     Mouth/Throat:     Pharynx: No oropharyngeal exudate or posterior oropharyngeal erythema.  Eyes:     Conjunctiva/sclera: Conjunctivae normal.     Pupils: Pupils are equal, round, and reactive to light.  Cardiovascular:     Rate and Rhythm: Normal rate and regular rhythm.     Pulses: Normal pulses.     Heart sounds: No murmur heard.   No gallop.  Pulmonary:     Effort: Pulmonary effort is normal.     Breath sounds: Normal breath sounds. No wheezing or rales.  Abdominal:     Palpations: Abdomen is soft.     Tenderness:  There is no abdominal tenderness.  Musculoskeletal:     Cervical back: Neck supple.     Right lower leg: No edema.     Left lower leg: No edema.  Lymphadenopathy:     Cervical: No cervical adenopathy.  Skin:    Findings:  No lesion or rash.     Comments: Multiple pigmented nevi  Neurological:     General: No focal deficit present.     Mental Status: He is alert and oriented to person, place, and time.  Psychiatric:        Mood and Affect: Mood normal.        Behavior: Behavior normal.           Assessment & Plan:

## 2021-11-03 NOTE — Assessment & Plan Note (Signed)
Quiet with the advair

## 2021-11-03 NOTE — Assessment & Plan Note (Signed)
Healthy but needs to work on fitness Had flu vaccine and bivalent COVID shingrix soon He wants PSA after discussion Colon due 1/24

## 2021-11-03 NOTE — Assessment & Plan Note (Signed)
Doing okay with gabapentin at bedtime

## 2021-11-04 LAB — CBC
HCT: 48 % (ref 39.0–52.0)
Hemoglobin: 16.2 g/dL (ref 13.0–17.0)
MCHC: 33.8 g/dL (ref 30.0–36.0)
MCV: 97 fl (ref 78.0–100.0)
Platelets: 253 10*3/uL (ref 150.0–400.0)
RBC: 4.95 Mil/uL (ref 4.22–5.81)
RDW: 13.7 % (ref 11.5–15.5)
WBC: 5.7 10*3/uL (ref 4.0–10.5)

## 2021-11-04 LAB — COMPREHENSIVE METABOLIC PANEL
ALT: 41 U/L (ref 0–53)
AST: 29 U/L (ref 0–37)
Albumin: 4.8 g/dL (ref 3.5–5.2)
Alkaline Phosphatase: 62 U/L (ref 39–117)
BUN: 13 mg/dL (ref 6–23)
CO2: 29 mEq/L (ref 19–32)
Calcium: 9.5 mg/dL (ref 8.4–10.5)
Chloride: 99 mEq/L (ref 96–112)
Creatinine, Ser: 0.97 mg/dL (ref 0.40–1.50)
GFR: 90.68 mL/min (ref >60.00)
Glucose, Bld: 86 mg/dL (ref 70–99)
Potassium: 4.1 mEq/L (ref 3.5–5.1)
Sodium: 137 mEq/L (ref 135–145)
Total Bilirubin: 0.7 mg/dL (ref 0.2–1.2)
Total Protein: 7.5 g/dL (ref 6.0–8.3)

## 2021-11-04 LAB — PSA: PSA: 0.43 ng/mL (ref 0.10–4.00)

## 2022-01-10 ENCOUNTER — Other Ambulatory Visit: Payer: Self-pay | Admitting: Neurology

## 2022-01-16 ENCOUNTER — Encounter: Payer: Self-pay | Admitting: Neurology

## 2022-01-16 ENCOUNTER — Ambulatory Visit: Payer: Managed Care, Other (non HMO) | Admitting: Neurology

## 2022-01-16 ENCOUNTER — Other Ambulatory Visit: Payer: Self-pay

## 2022-01-16 VITALS — BP 124/77 | HR 67 | Ht 70.0 in | Wt 236.0 lb

## 2022-01-16 DIAGNOSIS — R202 Paresthesia of skin: Secondary | ICD-10-CM

## 2022-01-16 MED ORDER — GABAPENTIN 300 MG PO CAPS: 300.0000 mg | ORAL_CAPSULE | Freq: Every day | ORAL | 3 refills | Status: DC

## 2022-01-16 NOTE — Patient Instructions (Signed)
Return to clinic in 1 year.

## 2022-01-16 NOTE — Progress Notes (Signed)
° ° °  Follow-up Visit   Date: 01/16/22    Paul Holland AGE MRN: [id number] DOB: 06-21-1971   Interim History: Paul Holland is a 51 y.o. right-handed Caucasian male with asthma returning to the clinic for follow-up of low back pain and feet tingling.  The patient was accompanied to the clinic by self.  There has been no progression of tingling involving the feet, which remains in the soles of the feet.  Symptoms are well-controlled on gabapentin 371m at bedtime.  No new complaints, weakness, falls or imbalance.   Medications:  Current Outpatient Medications on File Prior to Visit  Medication Sig Dispense Refill   ADVAIR DISKUS 250-50 MCG/ACT AEPB USE 1 INHALATION TWICE A DAY 180 each 3   Ascorbic Acid (VITAMIN C PO) Take by mouth.     cyclobenzaprine (FLEXERIL) 5 MG tablet TAKE 1 TABLET BY MOUTH AT BEDTIME AS NEEDED FOR MUSCLE SPASM 90 tablet 3   gabapentin (NEURONTIN) 300 MG capsule Take 1 capsule (300 mg total) by mouth at bedtime. 90 capsule 3   GARLIC PO Take 1 capsule by mouth daily.     loratadine (CLARITIN) 10 MG tablet Take 10-20 mg by mouth daily.     Multiple Vitamin (MULTI-VITAMINS) TABS Take 1 tablet by mouth daily.     Omega-3 Fatty Acids (FISH OIL PO) Take by mouth.     TURMERIC PO Take 1 capsule by mouth daily.     VITAMIN D, CHOLECALCIFEROL, PO Take by mouth.     No current facility-administered medications on file prior to visit.    Allergies: No Known Allergies  Vital Signs:  BP 124/77    Pulse 67    Ht _0  (1.778 m)    Wt 236 lb (107 kg)    SpO2 98%    BMI 33.86 kg/m    Neurological Exam: MENTAL STATUS including orientation to time, place, person, recent and remote memory, attention span and concentration, language, and fund of knowledge is normal.  Speech is not dysarthric.  CRANIAL NERVES:  Extraocular muscles intact.   MOTOR:  Motor strength is 5/5 in all extremities, including distally.   No pronator drift.  Tone is normal.    MSRs:  Reflexes  are 2+/4 throughout.  SENSORY:  Intact to vibration, pin prick, and temperature throughout.  COORDINATION/GAIT: Gait is normal, stressed and tandem gait intact  Data: MRI lumbar spine without contrast 12/18/2017: Mild degenerative disc disease and facet joint arthritis. No herniated disc, spinal stenosis, or foraminal stenosis. 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  NCS/EMG and MRI lumbar spine has been normal.  Symptoms well-controlled on gabapentin 3054mat bedtime for the past several years.  Medication refilled.  He may request PCP to take over writing gabapentin going forward, as symptoms are stable and there has been no medication adjustment.  Otherwise, he is welcome to return here in 1 year.   Thank you for allowing me to participate in patient's care.  If I can answer any additional questions, I would be pleased to do so.    Sincerely,    Cadence Haslam K. PaPosey ProntoDO

## 2022-03-21 ENCOUNTER — Ambulatory Visit (INDEPENDENT_AMBULATORY_CARE_PROVIDER_SITE_OTHER)
Admission: RE | Admit: 2022-03-21 | Discharge: 2022-03-21 | Disposition: A | Discharge status: Home / Self Care | Payer: Managed Care, Other (non HMO) | Source: Ambulatory Visit | Attending: Internal Medicine | Admitting: Internal Medicine

## 2022-03-21 ENCOUNTER — Ambulatory Visit: Payer: Managed Care, Other (non HMO) | Admitting: Internal Medicine

## 2022-03-21 ENCOUNTER — Encounter: Payer: Self-pay | Admitting: Internal Medicine

## 2022-03-21 VITALS — BP 120/82 | HR 83 | Ht 70.0 in | Wt 236.7 lb

## 2022-03-21 DIAGNOSIS — L723 Sebaceous cyst: Secondary | ICD-10-CM | POA: Diagnosis not present

## 2022-03-21 DIAGNOSIS — R0781 Pleurodynia: Secondary | ICD-10-CM

## 2022-03-21 NOTE — Assessment & Plan Note (Signed)
Small and not inflamed on neck ?Advised observation only ?

## 2022-03-21 NOTE — Assessment & Plan Note (Addendum)
No trauma ?No mass noted ?Will just check rib x-rays ?Reassured---tylenol/NSAIDs prn ?

## 2022-03-21 NOTE — Progress Notes (Signed)
? ?Subjective:  ? ? Patient ID: Paul Holland, male    DOB: [birth year], 51 y.o.   MRN: [id number] ? ?HPI ?Here due to concerns about a couple of knots ? ?Has long standing cyst on back of neck ?Wife has noticed it is bigger ?No pain ?He hasn't really noticed ? ?Also has a new ?knot on LUQ abdomen--noticed it a few days ago ?It bothered him when lying on his stomach (or if wife up against him) ? ?Current Outpatient Medications on File Prior to Visit  ?Medication Sig Dispense Refill  ? ADVAIR DISKUS 250-50 MCG/ACT AEPB USE 1 INHALATION TWICE A DAY 180 each 3  ? Ascorbic Acid (VITAMIN C PO) Take by mouth.    ? cyclobenzaprine (FLEXERIL) 5 MG tablet TAKE 1 TABLET BY MOUTH AT BEDTIME AS NEEDED FOR MUSCLE SPASM 90 tablet 3  ? gabapentin (NEURONTIN) 300 MG capsule Take 1 capsule (300 mg total) by mouth at bedtime. 90 capsule 3  ? GARLIC PO Take 1 capsule by mouth daily.    ? loratadine (CLARITIN) 10 MG tablet Take 10-20 mg by mouth daily.    ? Multiple Vitamin (MULTI-VITAMINS) TABS Take 1 tablet by mouth daily.    ? Omega-3 Fatty Acids (FISH OIL PO) Take by mouth.    ? TURMERIC PO Take 1 capsule by mouth daily.    ? VITAMIN D, CHOLECALCIFEROL, PO Take by mouth.    ? ?No current facility-administered medications on file prior to visit.  ? ? ?No Known Allergies ? ?Past Medical History:  ?Diagnosis Date  ? Allergic rhinitis due to pollen   ? Anxiety   ? Asthma   ? 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 resin exposure at work  ? Heart murmur   ? probably mild MR. Sees Dr Nehemiah Massed  ? Osteoarthritis of wrist   ? ? ?Past Surgical History:  ?Procedure Laterality Date  ? Cartilage removed from ribs  1981  ? KNEE ARTHROSCOPY WITH ANTERIOR CRUCIATE LIGAMENT (ACL) REPAIR Right 6/14 & 8/14  ? meniscus repair and tibial fracture repair (bone graft). ACL not repaired x 3  ? ? ?Family History  ?Problem Relation Age of Onset  ? Hypertension Mother   ? Diabetes Mother   ? Hypertension Father   ? Colon polyps Father   ? Diabetes Brother   ?  Hyperlipidemia Brother   ? Hypertension Brother   ? Colon cancer Paternal Uncle   ? Lung cancer Paternal Grandfather   ? Heart disease Neg Hx   ? Esophageal cancer Neg Hx   ? Inflammatory bowel disease Neg Hx   ? Liver disease Neg Hx   ? Pancreatic cancer Neg Hx   ? Rectal cancer Neg Hx   ? Stomach cancer Neg Hx   ? ? ?Social History  ? ?Socioeconomic History  ? Marital status: Married  ?  Spouse name: Not on file  ? Number of children: 3  ? Years of education: 73  ? Highest education level: Some college, no degree  ?Occupational History  ? 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  Comment: Systems developer  ?Tobacco Use  ? Smoking status: Former  ?  Types: Cigarettes  ?  Quit date: 11/04/2007  ?  Years since quitting: 14.3  ? Smokeless tobacco: Never  ?Vaping Use  ? Vaping Use: Never used  ?Substance and Sexual Activity  ? Alcohol use: Yes  ?  Comment: Occasionally - sporadic  ? Drug use: No  ? Sexual activity: Not on file  ?  Other Topics Concern  ? Not on file  ?Social History Narrative  ? 2nd marriage  ? 3 children who live with their mother in [place]----regular visits to them.  ? Lives in a 2 story home.    ? Education: some college.  Works as a Librarian, academic for a [employer]    ? Left Handed  ? Drinks Caffeine   ? ?Social Determinants of Health  ? ?Financial Resource Strain: Not on file  ?Food Insecurity: Not on file  ?Transportation Needs: Not on file  ?Physical Activity: Not on file  ?Stress: Not on file  ?Social Connections: Not on file  ?Intimate Partner Violence: Not on file  ? ?Review of Systems ?No trauma ?Not sick--no fever` ?   ?Objective:  ? Physical Exam ?Constitutional:   ?   Appearance: Normal appearance.  ?Pulmonary:  ?   Comments: Mild tenderness over anterior T10 rib on right ?No mass  ?Skin: ?   Comments: Under 1 cm subcutaneous cyst in right posterior occipital area. No tenderness  ?Neurological:  ?   Mental Status: He is alert.  ?  ? ? ? ? ?   ?Assessment & Plan:  ? ?

## 2022-03-22 ENCOUNTER — Encounter: Payer: Self-pay | Admitting: Internal Medicine

## 2022-10-02 ENCOUNTER — Encounter: Payer: Self-pay | Admitting: Neurology

## 2022-11-06 ENCOUNTER — Encounter: Payer: Self-pay | Admitting: Internal Medicine

## 2022-11-06 ENCOUNTER — Ambulatory Visit (INDEPENDENT_AMBULATORY_CARE_PROVIDER_SITE_OTHER): Payer: Managed Care, Other (non HMO) | Admitting: Internal Medicine

## 2022-11-06 VITALS — BP 112/78 | HR 72 | Temp 97.3°F | Ht 70.5 in | Wt 236.0 lb

## 2022-11-06 DIAGNOSIS — J683 Other acute and subacute respiratory conditions due to chemicals, gases, fumes and vapors: Secondary | ICD-10-CM | POA: Diagnosis not present

## 2022-11-06 DIAGNOSIS — Z23 Encounter for immunization: Secondary | ICD-10-CM

## 2022-11-06 DIAGNOSIS — G629 Polyneuropathy, unspecified: Secondary | ICD-10-CM

## 2022-11-06 DIAGNOSIS — Z0000 Encounter for general adult medical examination without abnormal findings: Secondary | ICD-10-CM | POA: Diagnosis not present

## 2022-11-06 NOTE — Addendum Note (Signed)
Addended by: Pilar Grammes on: 11/06/2022 04:06 PM   Modules accepted: Orders

## 2022-11-06 NOTE — Assessment & Plan Note (Addendum)
Healthy Discussed increasing fitness efforts Colon due next month Will check PSA again Flu vaccine today--and shingrix COVID update at pharmacy

## 2022-11-06 NOTE — Assessment & Plan Note (Signed)
Controlled with the advair--he may need to change this due to his insurance

## 2022-11-06 NOTE — Progress Notes (Signed)
Subjective:    Patient ID: Paul Holland, male    DOB: 1971/07/15, 51 y.o.   MRN: [id number]  HPI Here for physical  Still seeing Dr Posey Pronto about his neuropathy Has been stable----some tingling Uses gabapentin at bedtime--didn't do well trying without it Uses cyclobenzaprine prn for his back--also at bedtime  Asthma controlled with advair Only takes daily Loratadine for allergies  Current Outpatient Medications on File Prior to Visit  Medication Sig Dispense Refill   ADVAIR DISKUS 250-50 MCG/ACT AEPB USE 1 INHALATION TWICE A DAY 180 each 3   Ascorbic Acid (VITAMIN C PO) Take by mouth.     cyclobenzaprine (FLEXERIL) 5 MG tablet TAKE 1 TABLET BY MOUTH AT BEDTIME AS NEEDED FOR MUSCLE SPASM 90 tablet 3   gabapentin (NEURONTIN) 300 MG capsule Take 1 capsule (300 mg total) by mouth at bedtime. 90 capsule 3   GARLIC PO Take 1 capsule by mouth daily.     loratadine (CLARITIN) 10 MG tablet Take 10-20 mg by mouth daily.     Multiple Vitamin (MULTI-VITAMINS) TABS Take 1 tablet by mouth daily.     Omega-3 Fatty Acids (FISH OIL PO) Take by mouth.     TURMERIC PO Take 1 capsule by mouth daily.     VITAMIN D, CHOLECALCIFEROL, PO Take by mouth.     No current facility-administered medications on file prior to visit.    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 Sees Dr Nehemiah Massed   Osteoarthritis of wrist     Past Surgical History:  Procedure Laterality Date   Cartilage removed from ribs  1981   KNEE ARTHROSCOPY WITH ANTERIOR CRUCIATE LIGAMENT (ACL) REPAIR Right 6/14 & 8/14   meniscus repair and tibial fracture repair (bone graft). ACL not repaired x 3    Family History  Problem Relation Age of Onset   Hypertension Mother    Diabetes Mother    Hypertension Father    Colon polyps Father    Diabetes Brother     Hyperlipidemia Brother    Hypertension Brother    Colon cancer Paternal Uncle    Lung cancer Paternal Grandfather    Heart disease Neg Hx    Esophageal cancer Neg Hx    Inflammatory bowel disease Neg Hx    Liver disease Neg Hx    Pancreatic cancer Neg Hx    Rectal cancer Neg Hx    Stomach cancer Neg Hx     Social History   Socioeconomic History   Marital status: Married    Spouse name: Not on file   Number of children: 3   Years of education: 14   Highest education level: Some college, no degree  Occupational History   Occupation: Librarian, academic-- factory    Comment: Systems developer  Tobacco Use   Smoking status: Former    Types: Cigarettes    Quit date: 11/04/2007    Years since quitting: 15.0    Passive exposure: Never   Smokeless tobacco: Never  Vaping Use   Vaping Use: Never used  Substance and Sexual Activity   Alcohol use: Yes    Comment: Occasionally - sporadic   Drug use: No   Sexual activity: Not on file  Other Topics Concern   Not on file  Social History Narrative   2nd marriage   3 children who live with their mother in [place]----regular visits to them.   Lives in a 2 story home.     Education: some college.  Works as a Librarian, academic for a [employer]     Left Handed   Drinks Caffeine    Social Determinants of Health   Financial Resource Strain: Not on file  Food Insecurity: Not on file  Transportation Needs: Not on file  Physical Activity: Not on file  Stress: Not on file  Social Connections: Not on file  Intimate Partner Violence: Not on file   Review of Systems  Constitutional:        Weight up and down this year Walks and has treadmill--has step and weights in garage Wears seat belt  HENT:  Positive for tinnitus. Negative for dental problem and hearing loss.        Keeps up with dentist  Eyes:  Negative for visual disturbance.       No diplopia or unilateral vision loss  Respiratory:  Negative for cough, chest tightness and shortness  of breath.   Cardiovascular:  Negative for chest pain and leg swelling.       Rare sense of fast heart beat--at rest    Gastrointestinal:  Negative for constipation.       Occ blood in stool and on TP (bright red) Does have hemorrhoid No heartburn  Endocrine: Negative for polydipsia and polyuria.  Genitourinary:        Slight dribbling No sexual problems  Musculoskeletal:  Positive for back pain. Negative for arthralgias and joint swelling.       Gets dry needling in back every 8 weeks by PT  Skin:  Negative for rash.       Sees derm yearly  Allergic/Immunologic: Positive for environmental allergies. Negative for immunocompromised state.  Neurological:  Negative for dizziness, syncope and light-headedness.       Occ headaches  Hematological:  Negative for adenopathy. Does not bruise/bleed easily.  Psychiatric/Behavioral:  Negative for dysphoric mood and sleep disturbance.        Persistent anxiety---speaks to therapist monthly with good results       Objective:   Physical Exam Constitutional:      Appearance: Normal appearance.  HENT:     Right Ear: Tympanic membrane and ear canal normal.     Left Ear: Tympanic membrane and ear canal normal.     Mouth/Throat:     Pharynx: No oropharyngeal exudate or posterior oropharyngeal erythema.  Eyes:     Conjunctiva/sclera: Conjunctivae normal.     Pupils: Pupils are equal, round, and reactive to light.  Cardiovascular:     Rate and Rhythm: Normal rate and regular rhythm.     Pulses: Normal pulses.     Heart sounds: No murmur heard.    No gallop.  Pulmonary:     Effort: Pulmonary effort is normal.     Breath sounds: Normal breath sounds. No wheezing or rales.  Abdominal:     Palpations: Abdomen is soft.     Tenderness: There is no abdominal tenderness.  Musculoskeletal:     Cervical back: Neck supple.     Right lower leg: No edema.     Left lower leg: No edema.  Lymphadenopathy:     Cervical: No cervical adenopathy.   Skin:    Findings: No lesion or rash.  Neurological:     General: No focal deficit present.  Mental Status: He is alert and oriented to person, place, and time.  Psychiatric:        Mood and Affect: Mood normal.        Behavior: Behavior normal.            Assessment & Plan:

## 2022-11-06 NOTE — Assessment & Plan Note (Signed)
Uses gabapentin '300mg'$  at bedtime

## 2022-11-07 LAB — COMPREHENSIVE METABOLIC PANEL
ALT: 28 U/L (ref 0–53)
AST: 23 U/L (ref 0–37)
Albumin: 4.7 g/dL (ref 3.5–5.2)
Alkaline Phosphatase: 65 U/L (ref 39–117)
BUN: 16 mg/dL (ref 6–23)
CO2: 29 mEq/L (ref 19–32)
Calcium: 9.4 mg/dL (ref 8.4–10.5)
Chloride: 99 mEq/L (ref 96–112)
Creatinine, Ser: 1 mg/dL (ref 0.40–1.50)
GFR: 86.8 mL/min (ref >60.00)
Glucose, Bld: 91 mg/dL (ref 70–99)
Potassium: 4.1 mEq/L (ref 3.5–5.1)
Sodium: 138 mEq/L (ref 135–145)
Total Bilirubin: 0.6 mg/dL (ref 0.2–1.2)
Total Protein: 7.6 g/dL (ref 6.0–8.3)

## 2022-11-07 LAB — CBC
HCT: 47.8 % (ref 39.0–52.0)
Hemoglobin: 16.7 g/dL (ref 13.0–17.0)
MCHC: 34.8 g/dL (ref 30.0–36.0)
MCV: 93.8 fl (ref 78.0–100.0)
Platelets: 294 10*3/uL (ref 150.0–400.0)
RBC: 5.1 Mil/uL (ref 4.22–5.81)
RDW: 13.2 % (ref 11.5–15.5)
WBC: 7.8 10*3/uL (ref 4.0–10.5)

## 2022-11-13 ENCOUNTER — Encounter: Payer: Self-pay | Admitting: Gastroenterology

## 2022-11-13 ENCOUNTER — Other Ambulatory Visit: Payer: Self-pay | Admitting: Internal Medicine

## 2022-11-17 ENCOUNTER — Encounter: Payer: Self-pay | Admitting: Neurology

## 2022-12-08 ENCOUNTER — Ambulatory Visit (AMBULATORY_SURGERY_CENTER): Payer: Managed Care, Other (non HMO) | Admitting: *Deleted

## 2022-12-08 VITALS — Ht 70.0 in | Wt 240.0 lb

## 2022-12-08 DIAGNOSIS — Z800 Family history of malignant neoplasm of digestive organs: Secondary | ICD-10-CM

## 2022-12-08 DIAGNOSIS — Z8371 Family history of adenomatous and serrated polyps: Secondary | ICD-10-CM

## 2022-12-08 NOTE — Progress Notes (Signed)
No egg or soy allergy known to patient  No issues known to pt with past sedation with any surgeries or procedures Patient denies ever being told they had issues or difficulty with intubation  No FH of Malignant Hyperthermia Pt is not on diet pills Pt is not on  home 02  Pt is not on blood thinners  Pt denies issues with constipation  Pt is not on dialysis Pt denies any upcoming cardiac testing Pt encouraged to use to use Singlecare or Goodrx to reduce cost  Patient's chart reviewed by John Nulty CNRA prior to previsit and patient appropriate for the [facility].  Previsit completed and red dot placed by patient's name on their procedure day (on provider's schedule).  . Visit by phone Instructions sent by mail and my chart 

## 2022-12-20 ENCOUNTER — Encounter: Payer: Self-pay | Admitting: Internal Medicine

## 2022-12-20 MED ORDER — CYCLOBENZAPRINE HCL 5 MG PO TABS: ORAL_TABLET | ORAL | 1 refills | Status: AC

## 2022-12-20 MED ORDER — GABAPENTIN 300 MG PO CAPS: 300.0000 mg | ORAL_CAPSULE | Freq: Every day | ORAL | 3 refills | Status: DC

## 2022-12-26 ENCOUNTER — Encounter: Payer: Self-pay | Admitting: Gastroenterology

## 2022-12-29 ENCOUNTER — Encounter: Payer: Self-pay | Admitting: Gastroenterology

## 2022-12-29 ENCOUNTER — Ambulatory Visit (AMBULATORY_SURGERY_CENTER): Payer: Managed Care, Other (non HMO) | Admitting: Gastroenterology

## 2022-12-29 VITALS — BP 134/85 | HR 75 | Temp 98.6°F | Resp 12 | Ht 70.0 in | Wt 240.0 lb

## 2022-12-29 DIAGNOSIS — Z86010 Personal history of colonic polyps: Secondary | ICD-10-CM

## 2022-12-29 DIAGNOSIS — Z09 Encounter for follow-up examination after completed treatment for conditions other than malignant neoplasm: Secondary | ICD-10-CM | POA: Diagnosis not present

## 2022-12-29 DIAGNOSIS — Z800 Family history of malignant neoplasm of digestive organs: Secondary | ICD-10-CM | POA: Diagnosis not present

## 2022-12-29 DIAGNOSIS — Z8371 Family history of adenomatous and serrated polyps: Secondary | ICD-10-CM

## 2022-12-29 MED ORDER — SODIUM CHLORIDE 0.9 % IV SOLN: 500.0000 mL | Freq: Once | INTRAVENOUS | Status: DC

## 2022-12-29 NOTE — Progress Notes (Unsigned)
VS completed by DT.  Pt's states no medical or surgical changes since previsit or office visit.  

## 2022-12-29 NOTE — Progress Notes (Unsigned)
GASTROENTEROLOGY PROCEDURE H&P NOTE   Primary Care Physician: Venia Carbon, MD  HPI: Paul Holland is a 52 y.o. male who presents for Colonoscopy for surveillance in setting of previous tubular adenomas as well as family history of colon cancer.  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 Sees Dr Nehemiah Massed   Osteoarthritis of wrist    Past Surgical History:  Procedure Laterality Date   Cartilage removed from ribs  1981   COLONOSCOPY     KNEE ARTHROSCOPY WITH ANTERIOR CRUCIATE LIGAMENT (ACL) REPAIR Right 6/14 & 8/14   meniscus repair and tibial fracture repair (bone graft). ACL not repaired x 3   Current Outpatient Medications  Medication Sig Dispense Refill   ADVAIR DISKUS 250-50 MCG/ACT AEPB USE 1 INHALATION TWICE A DAY 180 each 3   Ascorbic Acid (VITAMIN C PO) Take by mouth.     cyclobenzaprine (FLEXERIL) 5 MG tablet TAKE 1 TABLET BY MOUTH AT BEDTIME AS NEEDED FOR MUSCLE SPASM 90 tablet 1   gabapentin (NEURONTIN) 300 MG capsule Take 1 capsule (300 mg total) by mouth at bedtime. 90 capsule 3   GARLIC PO Take 1 capsule by mouth daily.     loratadine (CLARITIN) 10 MG tablet Take 10-20 mg by mouth daily.     Multiple Vitamin (MULTI-VITAMINS) TABS Take 1 tablet by mouth daily.     Omega-3 Fatty Acids (FISH OIL PO) Take by mouth.     TURMERIC PO Take 1 capsule by mouth daily.     VITAMIN D, CHOLECALCIFEROL, PO Take by mouth.     Current Facility-Administered Medications  Medication Dose Route Frequency Provider Last Rate Last Admin   0.9 %  sodium chloride infusion  500 mL Intravenous Once Mansouraty, Telford Nab., MD        Current Outpatient Medications:    ADVAIR DISKUS 250-50 MCG/ACT AEPB, USE 1 INHALATION TWICE A DAY, Disp: 180 each, Rfl: 3   Ascorbic Acid (VITAMIN C PO), Take by mouth., Disp: , Rfl:     cyclobenzaprine (FLEXERIL) 5 MG tablet, TAKE 1 TABLET BY MOUTH AT BEDTIME AS NEEDED FOR MUSCLE SPASM, Disp: 90 tablet, Rfl: 1   gabapentin (NEURONTIN) 300 MG capsule, Take 1 capsule (300 mg total) by mouth at bedtime., Disp: 90 capsule, Rfl: 3   GARLIC PO, Take 1 capsule by mouth daily., Disp: , Rfl:    loratadine (CLARITIN) 10 MG tablet, Take 10-20 mg by mouth daily., Disp: , Rfl:    Multiple Vitamin (MULTI-VITAMINS) TABS, Take 1 tablet by mouth daily., Disp: , Rfl:    Omega-3 Fatty Acids (FISH OIL PO), Take by mouth., Disp: , Rfl:    TURMERIC PO, Take 1 capsule by mouth daily., Disp: , Rfl:    VITAMIN D, CHOLECALCIFEROL, PO, Take by mouth., Disp: , Rfl:   Current Facility-Administered Medications:    0.9 %  sodium chloride infusion, 500 mL, Intravenous, Once, Mansouraty, Telford Nab., MD No Known Allergies Family History  Problem Relation Age of Onset   Hypertension Mother    Diabetes Mother    Hypertension Father    Colon polyps Father    Diabetes Brother    Hyperlipidemia Brother    Hypertension Brother    Colon cancer Paternal Uncle    Lung cancer Paternal Grandfather  Heart disease Neg Hx    Esophageal cancer Neg Hx    Inflammatory bowel disease Neg Hx    Liver disease Neg Hx    Pancreatic cancer Neg Hx    Rectal cancer Neg Hx    Stomach cancer Neg Hx    Social History   Socioeconomic History   Marital status: Married    Spouse name: Not on file   Number of children: 3   Years of education: 14   Highest education level: Some college, no degree  Occupational History   Occupation: Librarian, 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   Lives in a 2 story home.     Education: some college.  Works as a Librarian, academic for a [employer]     Left Handed   Drinks Caffeine    Social Determinants of Health   Financial Resource Strain: Not on file  Food Insecurity: Not on file  Transportation Needs: Not on file  Physical Activity: Not on file  Stress: Not on file  Social Connections: Not on file  Intimate Partner Violence: Not on file    Physical Exam: Today's Vitals   12/29/22 1516  BP: 123/66  Pulse: 81  Temp: 98.6 F (37 C)  TempSrc: Temporal  SpO2: 98%  Weight: 240 lb (108.9 kg)  Height: '5\' 10"'$  (1.778 m)   Body mass index is 34.44 kg/m. GEN: NAD EYE: Sclerae anicteric ENT: MMM CV: Non-tachycardic GI: Soft, NT/ND NEURO:  Alert & Oriented x 3  Lab Results: No results for input(s): "WBC", "HGB", "HCT", "PLT" in the last 72 hours. BMET No results for input(s): "NA", "K", "CL", "CO2", "GLUCOSE", "BUN", "CREATININE", "CALCIUM" in the last 72 hours. LFT No results for input(s): "PROT", "ALBUMIN", "AST", "ALT", "ALKPHOS", "BILITOT", "BILIDIR", "IBILI" in the last 72 hours. PT/INR No results for input(s): "LABPROT", "INR" in the last 72 hours.   Impression / Plan: This is a 52 y.o.male who presents for Colonoscopy for surveillance in setting of previous tubular adenomas as well as family history of colon cancer.  The risks and benefits of endoscopic evaluation/treatment were discussed with the patient and/or family; these include but are not limited to the risk of perforation, infection, bleeding, missed lesions, lack of diagnosis, severe illness requiring hospitalization, as well as anesthesia and sedation related illnesses.  The patient's history has been reviewed, patient examined, no change in status, and deemed stable for procedure.  The  patient and/or family is agreeable to proceed.    Justice Britain, MD [facility] Advanced Endoscopy Office # [phone]

## 2022-12-29 NOTE — Progress Notes (Unsigned)
Report to pacu rn. Vss. Care resumed by rn. 

## 2022-12-29 NOTE — Op Note (Signed)
[facility] Patient Name: Paul Holland Procedure Date: 12/29/2022 3:52 PM MRN: [id number] Endoscopist: Justice Britain , MD, [id number] Age: 52 Referring MD:  Date of Birth: [birth year] Gender: Male Account #: [id number] Procedure:                Colonoscopy Indications:              Screening in patient at increased risk: Family                            history of 1st-degree relative with colorectal                            cancer before age 68 years, Surveillance: Personal                            history of adenomatous polyps on last colonoscopy 3                            years ago Medicines:                Monitored Anesthesia Care Procedure:                Pre-Anesthesia Assessment:                           - Prior to the procedure, a History and Physical                            was performed, and patient medications and                            allergies were reviewed. The patient's tolerance of                            previous anesthesia was also reviewed. The risks                            and benefits of the procedure and the sedation                            options and risks were discussed with the patient.                            All questions were answered, and informed consent                            was obtained. Prior Anticoagulants: The patient has                            taken no anticoagulant or antiplatelet agents. ASA                            Grade Assessment: II - A patient with mild systemic  disease. After reviewing the risks and benefits,                            the patient was deemed in satisfactory condition to                            undergo the procedure.                           After obtaining informed consent, the colonoscope                            was passed under direct vision. Throughout the                            procedure, the patient's blood pressure, pulse, and                             oxygen saturations were monitored continuously. The                            Olympus CF-HQ190L (Serial# [id number]) Colonoscope was                            introduced through the anus and advanced to the 3                            cm into the ileum. The colonoscopy was performed                            without difficulty. The patient tolerated the                            procedure. The quality of the bowel preparation was                            good. The terminal ileum, ileocecal valve,                            appendiceal orifice, and rectum were photographed. Scope In: 4:10:20 PM Scope Out: 4:22:50 PM Scope Withdrawal Time: 0 hours 9 minutes 0 seconds  Total Procedure Duration: 0 hours 12 minutes 30 seconds  Findings:                 The digital rectal exam findings include                            hemorrhoids. Pertinent negatives include no                            palpable rectal lesions.                           The terminal ileum and ileocecal valve appeared  normal.                           Multiple small-mouthed diverticula were found in                            the recto-sigmoid colon and sigmoid colon.                           Normal mucosa was found in the entire colon.                           Non-bleeding non-thrombosed internal hemorrhoids                            were found during retroflexion, during perianal                            exam and during digital exam. The hemorrhoids were                            Grade II (internal hemorrhoids that prolapse but                            reduce spontaneously). Complications:            No immediate complications. Estimated Blood Loss:     Estimated blood loss was minimal. Impression:               - Hemorrhoids found on digital rectal exam.                           - The examined portion of the ileum was normal.                           -  Diverticulosis in the recto-sigmoid colon and in                            the sigmoid colon.                           - Normal mucosa in the entire examined colon.                           - Non-bleeding non-thrombosed internal hemorrhoids. Recommendation:           - The patient will be observed post-procedure,                            until all discharge criteria are met.                           - Discharge patient to home.                           - Patient has a contact number available for  emergencies. The signs and symptoms of potential                            delayed complications were discussed with the                            patient. Return to normal activities tomorrow.                            Written discharge instructions were provided to the                            patient.                           - High fiber diet.                           - Use FiberCon 1-2 tablets PO daily.                           - Continue present medications.                           - Repeat colonoscopy in 5 years for surveillance in                            setting of previous adenomatous tissue and family                            history of colon cancer.                           - The findings and recommendations were discussed                            with the patient.                           - The findings and recommendations were discussed                            with the patient's family. Justice Britain, MD 12/29/2022 4:37:32 PM

## 2022-12-29 NOTE — Patient Instructions (Addendum)
Thank you for letting us take care of your healthcare needs today. Please see handouts given to you on Diverticulosis and Hemorrhoids. High Fiber Diet. You may use FiberCon 1-2 tablets daily.     YOU HAD AN ENDOSCOPIC PROCEDURE TODAY AT [facility]:   Refer to the procedure report that was given to you for any specific questions about what was found during the examination.  If the procedure report does not answer your questions, please call your gastroenterologist to clarify.  If you requested that your care partner not be given the details of your procedure findings, then the procedure report has been included in a sealed envelope for you to review at your convenience later.  YOU SHOULD EXPECT: Some feelings of bloating in the abdomen. Passage of more gas than usual.  Walking can help get rid of the air that was put into your GI tract during the procedure and reduce the bloating. If you had a lower endoscopy (such as a colonoscopy or flexible sigmoidoscopy) you may notice spotting of blood in your stool or on the toilet paper. If you underwent a bowel prep for your procedure, you may not have a normal bowel movement for a few days.  Please Note:  You might notice some irritation and congestion in your nose or some drainage.  This is from the oxygen used during your procedure.  There is no need for concern and it should clear up in a day or so.  SYMPTOMS TO REPORT IMMEDIATELY:  Following lower endoscopy (colonoscopy or flexible sigmoidoscopy):  Excessive amounts of blood in the stool  Significant tenderness or worsening of abdominal pains  Swelling of the abdomen that is new, acute  Fever of 100F or higher    For urgent or emergent issues, a gastroenterologist can be reached at any hour by calling [phone]. Do not use MyChart messaging for urgent concerns.    DIET:  We do recommend a small meal at first, but then you may proceed to your regular diet.  Drink plenty of  fluids but you should avoid alcoholic beverages for 24 hours.  ACTIVITY:  You should plan to take it easy for the rest of today and you should NOT DRIVE or use heavy machinery until tomorrow (because of the sedation medicines used during the test).    FOLLOW UP: Our staff will call the number listed on your records the next business day following your procedure.  We will call around 7:15- 8:00 am to check on you and address any questions or concerns that you may have regarding the information given to you following your procedure. If we do not reach you, we will leave a message.     If any biopsies were taken you will be contacted by phone or by letter within the next 1-3 weeks.  Please call us at [phone] if you have not heard about the biopsies in 3 weeks.    SIGNATURES/CONFIDENTIALITY: You and/or your care partner have signed paperwork which will be entered into your electronic medical record.  These signatures attest to the fact that that the information above on your After Visit Summary has been reviewed and is understood.  Full responsibility of the confidentiality of this discharge information lies with you and/or your care-partner.

## 2023-01-01 ENCOUNTER — Telehealth: Payer: Self-pay

## 2023-01-01 NOTE — Telephone Encounter (Signed)
  Follow up Call-     12/29/2022    3:17 PM  Call back number  Post procedure Call Back phone  # [phone]  Permission to leave phone message Yes     Patient questions:  Do you have a fever, pain , or abdominal swelling? No. Pain Score  0 *  Have you tolerated food without any problems? Yes.    Have you been able to return to your normal activities? Yes.    Do you have any questions about your discharge instructions: Diet   No. Medications  No. Follow up visit  No.  Do you have questions or concerns about your Care? No.  Actions: * If pain score is 4 or above: No action needed, pain <4.

## 2023-01-22 ENCOUNTER — Ambulatory Visit: Payer: Managed Care, Other (non HMO) | Admitting: Neurology

## 2023-01-29 ENCOUNTER — Ambulatory Visit: Payer: Managed Care, Other (non HMO) | Admitting: Neurology

## 2023-03-08 ENCOUNTER — Ambulatory Visit: Payer: Managed Care, Other (non HMO)

## 2023-03-08 DIAGNOSIS — Z23 Encounter for immunization: Secondary | ICD-10-CM | POA: Diagnosis not present

## 2023-03-08 NOTE — Progress Notes (Signed)
Per orders of Dr. Viviana Simpler, injection of Shingrix given in left deltoid given by Ozzie Hoyle. Patient tolerated injection well.

## 2023-06-22 ENCOUNTER — Ambulatory Visit: Payer: Managed Care, Other (non HMO) | Admitting: Internal Medicine

## 2023-06-22 ENCOUNTER — Encounter: Payer: Self-pay | Admitting: Internal Medicine

## 2023-06-22 VITALS — BP 104/74 | HR 60 | Temp 98.0°F | Ht 70.5 in | Wt 239.0 lb

## 2023-06-22 DIAGNOSIS — M25562 Pain in left knee: Secondary | ICD-10-CM

## 2023-06-22 DIAGNOSIS — S83207A Unspecified tear of unspecified meniscus, current injury, left knee, initial encounter: Secondary | ICD-10-CM | POA: Insufficient documentation

## 2023-06-22 NOTE — Patient Instructions (Signed)
Try aleve --up to 2 twice a day OR ibuprofen (advil/motrin) 3-4 tabs up to 3 times a day Use over the counter topical diclofenac for your knee and foot

## 2023-06-22 NOTE — Assessment & Plan Note (Signed)
Seems to have mild medial meniscus injury Discussed aleve/advil Topical diclofenac If worsens--to ortho Rosita Kea) If persistent but not that bad--try cortisone injection

## 2023-06-22 NOTE — Progress Notes (Signed)
Subjective:    Patient ID: Paul Holland, male    DOB: [birth year], 52 y.o.   MRN: [id number]  HPI Here due to left knee pain  2 months ago--got sharp pain on the inside of the knee Pain with inside movement or prolonged standing 5 days after son's wedding--but doesn't remember any injury Variable day to day--just not going away  Worsened so set this up earlier this week Especially bad yesterday  Hasn't swollen--no bruising  Tried tylenol --mostly at night Iced at first--no clear help No NSAIDs  Some right foot pain--distal 4th metatarsal Happened on the treadmill a week ago or so--is some better now  Current Outpatient Medications on File Prior to Visit  Medication Sig Dispense Refill   ADVAIR DISKUS 250-50 MCG/ACT AEPB USE 1 INHALATION TWICE A DAY 180 each 3   Ascorbic Acid (VITAMIN C PO) Take by mouth.     cyclobenzaprine (FLEXERIL) 5 MG tablet TAKE 1 TABLET BY MOUTH AT BEDTIME AS NEEDED FOR MUSCLE SPASM 90 tablet 1   gabapentin (NEURONTIN) 300 MG capsule Take 1 capsule (300 mg total) by mouth at bedtime. 90 capsule 3   GARLIC PO Take 1 capsule by mouth daily.     loratadine (CLARITIN) 10 MG tablet Take 10-20 mg by mouth daily.     Multiple Vitamin (MULTI-VITAMINS) TABS Take 1 tablet by mouth daily.     Omega-3 Fatty Acids (FISH OIL PO) Take by mouth.     TURMERIC PO Take 1 capsule by mouth daily.     VITAMIN D, CHOLECALCIFEROL, PO Take by mouth.     No current facility-administered medications on file prior to visit.    No Known Allergies  Past Medical History:  Diagnosis Date   Allergic rhinitis due to pollen    Anxiety    Asthma    Chemical-induced asthma (HCC)    resin exposure at work   Heart murmur    probably mild MR. Sees Dr Gwen Pounds   Osteoarthritis of wrist     Past Surgical History:  Procedure Laterality Date   Cartilage removed from ribs  1981   COLONOSCOPY     KNEE ARTHROSCOPY WITH ANTERIOR CRUCIATE LIGAMENT (ACL) REPAIR Right 6/14 & 8/14    meniscus repair and tibial fracture repair (bone graft). ACL not repaired x 3    Family History  Problem Relation Age of Onset   Hypertension Mother    Diabetes Mother    Hypertension Father    Colon polyps Father    Diabetes Brother    Hyperlipidemia Brother    Hypertension Brother    Colon cancer Paternal Uncle    Lung cancer Paternal Grandfather    Heart disease Neg Hx    Esophageal cancer Neg Hx    Inflammatory bowel disease Neg Hx    Liver disease Neg Hx    Pancreatic cancer Neg Hx    Rectal cancer Neg Hx    Stomach cancer Neg Hx     Social History   Socioeconomic History   Marital status: Married    Spouse name: Not on file   Number of children: 3   Years of education: 14   Highest education level: Some college, no degree  Occupational History   Occupation: Merchandiser, retail-- factory    Comment: Building surveyor  Tobacco Use   Smoking status: Former    Current packs/day: 0.00    Types: Cigarettes    Quit date: 11/04/2007    Years since quitting: 15.6  Passive exposure: Never   Smokeless tobacco: Never  Vaping Use   Vaping status: Never Used  Substance and Sexual Activity   Alcohol use: Yes    Comment: Occasionally - sporadic   Drug use: No   Sexual activity: Not on file  Other Topics Concern   Not on file  Social History Narrative   2nd marriage   3 children who live with their mother in [place]----regular visits to them.   Lives in a 2 story home.     Education: some college.  Works as a Merchandiser, retail for a [employer].     Left Handed   Drinks Caffeine    Social Determinants of Health   Financial Resource Strain: Low Risk  (06/20/2023)   Overall Financial Resource Strain (CARDIA)    Difficulty of Paying Living Expenses: Not hard at all  Food Insecurity: No Food Insecurity (06/20/2023)   Hunger Vital Sign    Worried About Running Out of Food in the Last Year: Never true    Ran Out of Food in the Last Year: Never true  Transportation Needs: No  Transportation Needs (06/20/2023)   PRAPARE - Administrator, Civil Service (Medical): No    Lack of Transportation (Non-Medical): No  Physical Activity: Insufficiently Active (06/20/2023)   Exercise Vital Sign    Days of Exercise per Week: 4 days    Minutes of Exercise per Session: 30 min  Stress: No Stress Concern Present (06/20/2023)   [employer] of Occupational Health - Occupational Stress Questionnaire    Feeling of Stress : Only a little  Social Connections: Unknown (06/20/2023)   Social Connection and Isolation Panel [NHANES]    Frequency of Communication with Friends and Family: Once a week    Frequency of Social Gatherings with Friends and Family: Patient declined    Attends Religious Services: Never    Database administrator or Organizations: No    Attends Engineer, structural: Not on file    Marital Status: Married  Catering manager Violence: Not on file   Review of Systems Can't do his stepper Can walk on treadmill--but sore after    Objective:   Physical Exam Constitutional:      Appearance: Normal appearance.  Musculoskeletal:     Comments: Mild tenderness at distal right 4th metatarsal Normal weight bearing  Left knee--no swelling No ligament finding Pain with medial meniscus testing Normal flexion/extension (some pop with full extension)  Neurological:     Mental Status: He is alert.     Comments: Normal gait and leg strength            Assessment & Plan:

## 2023-07-20 ENCOUNTER — Encounter: Payer: Self-pay | Admitting: Internal Medicine

## 2023-07-20 ENCOUNTER — Other Ambulatory Visit: Payer: Self-pay | Admitting: Orthopedic Surgery

## 2023-07-20 DIAGNOSIS — S83232A Complex tear of medial meniscus, current injury, left knee, initial encounter: Secondary | ICD-10-CM

## 2023-07-20 MED ORDER — FLUTICASONE-SALMETEROL 250-50 MCG/ACT IN AEPB: 1.0000 | INHALATION_SPRAY | Freq: Two times a day (BID) | RESPIRATORY_TRACT | 11 refills | Status: DC

## 2023-08-01 ENCOUNTER — Encounter: Payer: Self-pay | Admitting: Orthopedic Surgery

## 2023-08-09 ENCOUNTER — Other Ambulatory Visit: Payer: Managed Care, Other (non HMO)

## 2023-08-11 ENCOUNTER — Other Ambulatory Visit: Payer: Managed Care, Other (non HMO)

## 2023-08-11 ENCOUNTER — Ambulatory Visit
Admission: RE | Admit: 2023-08-11 | Discharge: 2023-08-11 | Disposition: A | Discharge status: Home / Self Care | Payer: Managed Care, Other (non HMO) | Source: Ambulatory Visit | Attending: Orthopedic Surgery | Admitting: Orthopedic Surgery

## 2023-08-11 DIAGNOSIS — S83232A Complex tear of medial meniscus, current injury, left knee, initial encounter: Secondary | ICD-10-CM

## 2023-08-31 ENCOUNTER — Other Ambulatory Visit: Payer: Self-pay | Admitting: Orthopedic Surgery

## 2023-09-03 ENCOUNTER — Encounter: Payer: Self-pay | Admitting: Orthopedic Surgery

## 2023-09-03 NOTE — Anesthesia Preprocedure Evaluation (Addendum)
Anesthesia Evaluation  Patient identified by MRN, date of birth, ID band Patient awake    Reviewed: Allergy & Precautions, H&P , NPO status , Patient's Chart, lab work & pertinent test results  Airway Mallampati: II  TM Distance: >3 FB Neck ROM: Full    Dental no notable dental hx. (+) Caps No caps in upper front teeth:   Pulmonary asthma , former smoker   Pulmonary exam normal breath sounds clear to auscultation       Cardiovascular Normal cardiovascular exam+ Valvular Problems/Murmurs  Rhythm:Regular Rate:Normal  Hx heart murmur, cannot find echo nor comment in Epic about it   Neuro/Psych   Anxiety      Neuromuscular disease  negative psych ROS   GI/Hepatic negative GI ROS, Neg liver ROS,,,  Endo/Other  negative endocrine ROS    Renal/GU negative Renal ROS  negative genitourinary   Musculoskeletal  (+) Arthritis ,    Abdominal   Peds negative pediatric ROS (+)  Hematology negative hematology ROS (+)   Anesthesia Other Findings Chemical-induced asthma   Allergic rhinitis due to pollen Osteoarthritis of wrist  Heart murmur Anxiety  Idiopathic peripheral neuropathy  Reproductive/Obstetrics negative OB ROS                             Anesthesia Physical Anesthesia Plan  ASA: 2  Anesthesia Plan: General   Post-op Pain Management:    Induction: Intravenous  PONV Risk Score and Plan:   Airway Management Planned: LMA  Additional Equipment:   Intra-op Plan:   Post-operative Plan: Extubation in OR  Informed Consent: I have reviewed the patients History and Physical, chart, labs and discussed the procedure including the risks, benefits and alternatives for the proposed anesthesia with the patient or authorized representative who has indicated his/her understanding and acceptance.     Dental Advisory Given  Plan Discussed with: Anesthesiologist, CRNA and Surgeon  Anesthesia  Plan Comments: (Patient consented for risks of anesthesia including but not limited to:  - adverse reactions to medications - damage to eyes, teeth, lips or other oral mucosa - nerve damage due to positioning  - sore throat or hoarseness - Damage to heart, brain, nerves, lungs, other parts of body or loss of life  Patient voiced understanding.)        Anesthesia Quick Evaluation

## 2023-09-05 NOTE — Discharge Instructions (Addendum)
Arthroscopic Knee Surgery - Meniscus Repair   Post-Op Instructions   1. Bracing or crutches: Crutches will be provided at the time of discharge from the surgery center. Keep brace locked in extension at all times except as directed by physical therapy.    2. Ice: You may be provided with a device [facility]) that allows you to ice the affected area effectively. Otherwise you can ice manually.    3. Driving:  Plan on not driving for at least four weeks. Please note that you are advised NOT to drive while taking narcotic pain medications as you may be impaired and unsafe to drive.   4. Activity: Ankle pumps several times an hour while awake to prevent blood clots. Weight bearing: FLATFOOT BEARING FOR 4 WEEKS (WEIGHT OF LEG ONLY while using crutches and pushing through arms with operative leg down). Use crutches for at least 4 weeks, if not 6 based on your surgery. Bending and straightening the knee is unlimited, but do not flex your knee past 90 degrees until cleared by your therapist. Elevate knee above heart level as much as possible for one week. Avoid standing more than 5 minutes (consecutively) for the first week. No exercise involving the knee until cleared by the surgeon or physical therapist.  Avoid long distance travel for 4 weeks.   5. Medications:  - You have been provided a prescription for narcotic pain medicine. After surgery, take 1-2 narcotic tablets every 4 hours if needed for severe pain. If it has tylenol (acetaminophen), please do not take a total of more than 3000mg /day of tylenol.  - A prescription for anti-nausea medication will be provided in case the narcotic medicine causes nausea - take 1 tablet every 6 hours only if nauseated.  - Take ibuprofen 800 mg every 8 hours with food to reduce post-operative knee swelling. DO NOT STOP IBUPROFEN POST-OP UNTIL INSTRUCTED TO DO SO at first post-op office visit (10-14 days after surgery).  - Take enteric coated aspirin 325 mg once daily  for 4 weeks to prevent blood clots.  - Take tylenol 1000 every 8 hours for pain.  May stop tylenol 3 days after surgery or when you are having minimal pain. If your narcotic has tylenol (acetaminophen), please do not take a total of more than 3000mg /day of tylenol.    If you are taking prescription medication for anxiety, depression, insomnia, muscle spasm, chronic pain, or for attention deficit disorder you are advised that you are at a higher risk of adverse effects with use of narcotics post-op, including narcotic addiction/dependence, depressed breathing, death. If you use non-prescribed substances: alcohol, marijuana, cocaine, heroin, methamphetamines, etc., you are at a higher risk of adverse effects with use of narcotics post-op, including narcotic addiction/dependence, depressed breathing, death. You are advised that taking > 50 morphine milligram equivalents (MME) of narcotic pain medication per day results in twice the risk of overdose or death. For your prescription provided: oxycodone 5 mg - taking more than 6 tablets per day. Be advised that we will prescribe narcotics short-term, for acute post-operative pain only - 1 week for minor operations such as knee arthroscopy for meniscus tear resection, and 3 weeks for major operations such as knee repair/reconstruction surgeries.   6. Bandages: The physical therapist should change the bandages at the first post-op appointment. If needed, the dressing supplies have been provided to you. You may shower after this with waterproof bandaids covering the incisions.    7. Physical Therapy: 2 times per week for  the first 4 weeks, then 1-2 times per week from weeks 4-8 post-op. Therapy typically starts on post operative Day 3 or 4. You have been provided an order for physical therapy. The therapist will provide home exercises.   8. Work: May return to full work when off of crutches. May do light duty/desk job in approximately 1-2 weeks when off of  narcotics, pain is well-controlled, and swelling has decreased.   9. Post-Op Appointments: Your first post-op appointment will be with Dr. Allena Katz in approximately 2 weeks time.    If you find that they have not been scheduled please call the Orthopaedic Appointment front desk at [phone].      POLAR CARE INFORMATION  MassAdvertisement.it  How to use B[employer] Polar Care [facility] Therapy System?  YouTube   ShippingScam.co.uk  OPERATING INSTRUCTIONS  Start the product With dry hands, connect the transformer to the electrical connection located on the top of the cooler. Next, plug the transformer into an appropriate electrical outlet. The unit will automatically start running at this point.  To stop the pump, disconnect electrical power.  Unplug to stop the product when not in use. Unplugging the Polar Care unit turns it off. Always unplug immediately after use. Never leave it plugged in while unattended. Remove pad.    FIRST ADD WATER TO FILL LINE, THEN ICE---Replace ice when existing ice is almost melted  1 Discuss Treatment with your Licensed Health Care Practitioner and Use Only as Prescribed 2 Apply Insulation Barrier & Cold Therapy Pad 3 Check for Moisture 4 Inspect Skin Regularly  Tips and Trouble Shooting Usage Tips 1. Use cubed or chunked ice for optimal performance. 2. It is recommended to drain the Pad between uses. To drain the pad, hold the Pad upright with the hose pointed toward the ground. Depress the black plunger and allow water to drain out. 3. You may disconnect the Pad from the unit without removing the pad from the affected area by depressing the silver tabs on the hose coupling and gently pulling the hoses apart. The Pad and unit will seal itself and will not leak. Note: Some dripping during release is normal. 4. DO NOT RUN PUMP WITHOUT WATER! The pump in this unit is designed to run with water. Running the unit without water will cause  permanent damage to the pump. 5. Unplug unit before removing lid.  TROUBLESHOOTING GUIDE Pump not running, Water not flowing to the pad, Pad is not getting cold 1. Make sure the transformer is plugged into the wall outlet. 2. Confirm that the ice and water are filled to the indicated levels. 3. Make sure there are no kinks in the pad. 4. Gently pull on the blue tube to make sure the tube/pad junction is straight. 5. Remove the pad from the treatment site and ll it while the pad is lying at; then reapply. 6. Confirm that the pad couplings are securely attached to the unit. Listen for the double clicks (Figure 1) to confirm the pad couplings are securely attached.  Leaks    Note: Some condensation on the lines, controller, and pads is unavoidable, especially in warmer climates. 1. If using a Breg Polar Care Cold Therapy unit with a detachable Cold Therapy Pad, and a leak exists (other than condensation on the lines) disconnect the pad couplings. Make sure the silver tabs on the couplings are depressed before reconnecting the pad to the pump hose; then confirm both sides of the coupling are properly clicked in. 2. If  the coupling continues to leak or a leak is detected in the pad itself, stop using it and call B[employer] Customer Care at [phone].  Cleaning After use, empty and dry the unit with a soft cloth. Warm water and mild detergent may be used occasionally to clean the pump and tubes.  WARNING: The Polar Care Cube can be cold enough to cause serious injury, including full skin necrosis. Follow these Operating Instructions, and carefully read the Product Insert (see pouch on side of unit) and the Cold Therapy Pad Fitting Instructions (provided with each Cold Therapy Pad) prior to use.       PERIPHERAL NERVE BLOCK PATIENT INFORMATION  Your surgeon has requested a peripheral nerve block for your surgery. This anesthetic technique provides excellent post-operative pain relief for you in a  safe and effective manner. It will also help reduce the risk of nausea and vomiting and allow earlier discharge from the hospital.   The block is performed under sedation with ultrasound guidance prior to your procedure. Due to the sedation, your may or may not remember the block experience. The nerve block will begin to take effect anywhere from 5 to 30 minutes after being administered. You will be transported to the operating room from your surgery after the block is completed.   At the end of surgery, when the anesthesia wears off, you will notice a few things. Your may not be able to move or feel the part of your body targeted by the nerve block. These are normal experiences, and they will disappear as the block wears off.  If you had an interscalene nerve block performed (which is common for shoulder surgery), your voice can be very hoarse and you may feel that you are not able to take as deep a breath as you did before surgery. Some patients may also notice a droopy eyelid on the affected side. These symptoms will resolve once the block wears off.  Pain control: The nerve block technique used is a single injection that can last anywhere from 1-3 days. The duration of the numbness can vary between individuals. After leaving the hospital, it is important that you begin to take your prescribed pain medication when you start to sense the nerve block wearing off. This will help you avoid unpleasant pain at the time the nerve block wears off, which can sometimes be in the middle of the night. The block will only cover pain in the areas targeted by the nerve block so if you experience surgical pain outside of that area, please take your prescribed pain medication. Management of the "numb area": After a nerve block, you cannot feel pain, pressure, or temperature in the affected area so there is an increased risk for injury. You should take extra care to protect the affected areas until sensation and movement  returns. Please take caution to not come in contact with extremely hot or cold items because you will not be able to sense or protect yourself form the extremes of temperature.  You may experience some persistent numbness after the procedure by most neurological deficits resolve over time and the incidence of serious long term neurological complications attributable to peripheral nerve blocks are relatively uncommon.

## 2023-09-07 ENCOUNTER — Other Ambulatory Visit: Payer: Self-pay

## 2023-09-07 ENCOUNTER — Encounter
Admission: RE | Disposition: A | Discharge status: Home / Self Care | Payer: Self-pay | Source: Home / Self Care | Attending: Orthopedic Surgery

## 2023-09-07 ENCOUNTER — Ambulatory Visit: Payer: Managed Care, Other (non HMO) | Admitting: Anesthesiology

## 2023-09-07 ENCOUNTER — Ambulatory Visit
Admission: RE | Admit: 2023-09-07 | Discharge: 2023-09-07 | Disposition: A | Discharge status: Home / Self Care | Payer: Managed Care, Other (non HMO) | Attending: Orthopedic Surgery | Admitting: Orthopedic Surgery

## 2023-09-07 ENCOUNTER — Encounter: Payer: Self-pay | Admitting: Orthopedic Surgery

## 2023-09-07 DIAGNOSIS — S83242A Other tear of medial meniscus, current injury, left knee, initial encounter: Secondary | ICD-10-CM | POA: Insufficient documentation

## 2023-09-07 DIAGNOSIS — Z87891 Personal history of nicotine dependence: Secondary | ICD-10-CM | POA: Insufficient documentation

## 2023-09-07 DIAGNOSIS — X58XXXA Exposure to other specified factors, initial encounter: Secondary | ICD-10-CM | POA: Insufficient documentation

## 2023-09-07 HISTORY — DX: Hereditary and idiopathic neuropathy, unspecified: G60.9

## 2023-09-07 HISTORY — PX: KNEE ARTHROSCOPY WITH MENISCAL REPAIR: SHX5653

## 2023-09-07 SURGERY — ARTHROSCOPY, KNEE, WITH MENISCUS REPAIR
Anesthesia: General | Site: Knee | Laterality: Left

## 2023-09-07 MED ORDER — PROPOFOL 10 MG/ML IV BOLUS
INTRAVENOUS | Status: AC
  Filled 2023-09-07: qty 20

## 2023-09-07 MED ORDER — LIDOCAINE HCL (CARDIAC) PF 100 MG/5ML IV SOSY
PREFILLED_SYRINGE | INTRAVENOUS | Status: DC | PRN
  Administered 2023-09-07: 100 mg via INTRATRACHEAL

## 2023-09-07 MED ORDER — ONDANSETRON HCL 4 MG/2ML IJ SOLN
INTRAMUSCULAR | Status: DC | PRN
  Administered 2023-09-07: 4 mg via INTRAVENOUS

## 2023-09-07 MED ORDER — HYDROCODONE-ACETAMINOPHEN 5-325 MG PO TABS: 1.0000 | ORAL_TABLET | ORAL | 0 refills | Status: DC | PRN

## 2023-09-07 MED ORDER — PROPOFOL 10 MG/ML IV BOLUS
INTRAVENOUS | Status: DC | PRN
  Administered 2023-09-07: 200 mg via INTRAVENOUS

## 2023-09-07 MED ORDER — MIDAZOLAM HCL 2 MG/2ML IJ SOLN
INTRAMUSCULAR | Status: AC
  Filled 2023-09-07: qty 2

## 2023-09-07 MED ORDER — OXYCODONE HCL 5 MG PO TABS
10.0000 mg | ORAL_TABLET | Freq: Once | ORAL | Status: AC
  Administered 2023-09-07: 10 mg via ORAL

## 2023-09-07 MED ORDER — CEFAZOLIN SODIUM-DEXTROSE 2-4 GM/100ML-% IV SOLN
2.0000 g | INTRAVENOUS | Status: AC
  Administered 2023-09-07: 2 g via INTRAVENOUS

## 2023-09-07 MED ORDER — ONDANSETRON 4 MG PO TBDP: 4.0000 mg | ORAL_TABLET | Freq: Three times a day (TID) | ORAL | 0 refills | Status: DC | PRN

## 2023-09-07 MED ORDER — FENTANYL CITRATE (PF) 100 MCG/2ML IJ SOLN
INTRAMUSCULAR | Status: DC | PRN
  Administered 2023-09-07 (×2): 50 ug via INTRAVENOUS

## 2023-09-07 MED ORDER — ONDANSETRON HCL 4 MG/2ML IJ SOLN
INTRAMUSCULAR | Status: AC
  Filled 2023-09-07: qty 2

## 2023-09-07 MED ORDER — CEFAZOLIN SODIUM-DEXTROSE 2-3 GM-%(50ML) IV SOLR
INTRAVENOUS | Status: AC
  Filled 2023-09-07: qty 50

## 2023-09-07 MED ORDER — LACTATED RINGERS IV SOLN: INTRAVENOUS | Status: DC

## 2023-09-07 MED ORDER — LACTATED RINGERS IR SOLN
Status: DC | PRN
  Administered 2023-09-07: 12000 mL

## 2023-09-07 MED ORDER — FENTANYL CITRATE (PF) 100 MCG/2ML IJ SOLN
INTRAMUSCULAR | Status: AC
  Filled 2023-09-07: qty 2

## 2023-09-07 MED ORDER — OXYCODONE HCL 5 MG PO TABS
ORAL_TABLET | ORAL | Status: AC
  Filled 2023-09-07: qty 2

## 2023-09-07 MED ORDER — ASPIRIN 325 MG PO TBEC: 325.0000 mg | DELAYED_RELEASE_TABLET | Freq: Every day | ORAL | 0 refills | Status: AC

## 2023-09-07 MED ORDER — MIDAZOLAM HCL 5 MG/5ML IJ SOLN
INTRAMUSCULAR | Status: DC | PRN
  Administered 2023-09-07: 2 mg via INTRAVENOUS

## 2023-09-07 MED ORDER — SEVOFLURANE IN SOLN
RESPIRATORY_TRACT | Status: AC
  Filled 2023-09-07: qty 250

## 2023-09-07 MED ORDER — IBUPROFEN 800 MG PO TABS: 800.0000 mg | ORAL_TABLET | Freq: Three times a day (TID) | ORAL | 0 refills | Status: AC

## 2023-09-07 MED ORDER — DEXAMETHASONE SODIUM PHOSPHATE 4 MG/ML IJ SOLN
INTRAMUSCULAR | Status: AC
  Filled 2023-09-07: qty 1

## 2023-09-07 MED ORDER — LIDOCAINE-EPINEPHRINE 1 %-1:100000 IJ SOLN
INTRAMUSCULAR | Status: DC | PRN
  Administered 2023-09-07: 8 mL via INTRAMUSCULAR
  Administered 2023-09-07: 2 mL via INTRAMUSCULAR

## 2023-09-07 MED ORDER — DEXAMETHASONE SODIUM PHOSPHATE 4 MG/ML IJ SOLN
INTRAMUSCULAR | Status: DC | PRN
  Administered 2023-09-07: 4 mg via INTRAVENOUS

## 2023-09-07 MED ORDER — LIDOCAINE HCL (PF) 2 % IJ SOLN
INTRAMUSCULAR | Status: AC
  Filled 2023-09-07: qty 5

## 2023-09-07 MED ORDER — FENTANYL CITRATE PF 50 MCG/ML IJ SOSY: 50.0000 ug | PREFILLED_SYRINGE | INTRAMUSCULAR | Status: DC | PRN

## 2023-09-07 MED ORDER — ACETAMINOPHEN 500 MG PO TABS
1000.0000 mg | ORAL_TABLET | Freq: Three times a day (TID) | ORAL | 2 refills | Status: DC
Start: 2023-09-07 — End: 2023-10-11

## 2023-09-07 SURGICAL SUPPLY — 37 items
ADPR IRR PORT MULTIBAG TUBE (MISCELLANEOUS) ×2
APL PRP STRL LF DISP 70% ISPRP (MISCELLANEOUS) ×1
BLADE FULL RADIUS 3.5 (BLADE) ×1 IMPLANT
BLADE SHAVER 4.5X7 STR FR (MISCELLANEOUS) ×1 IMPLANT
BLADE SURG 15 STRL LF DISP TIS (BLADE) ×1 IMPLANT
BLADE SURG 15 STRL SS (BLADE) ×1
BLADE SURG SZ11 CARB STEEL (BLADE) ×1 IMPLANT
BRACE T-SCOPE KNEE POSTOP (MISCELLANEOUS) IMPLANT
CARTRIDGE SUT 2-0 NONSTITCH (Anchor) IMPLANT
CHLORAPREP W/TINT 26 (MISCELLANEOUS) ×1 IMPLANT
COOLER POLAR GLACIER W/PUMP (MISCELLANEOUS) ×1 IMPLANT
COVER LIGHT HANDLE UNIVERSAL (MISCELLANEOUS) ×2 IMPLANT
DRAPE EXTREMITY T 121X128X90 (DISPOSABLE) ×1 IMPLANT
DRAPE IMP U-DRAPE 54X76 (DRAPES) ×1 IMPLANT
GAUZE SPONGE 4X4 12PLY STRL (GAUZE/BANDAGES/DRESSINGS) ×1 IMPLANT
GLOVE SURG ENC MOIS LTX SZ7.5 (GLOVE) ×2 IMPLANT
GLOVE SURG UNDER LTX SZ8 (GLOVE) ×1 IMPLANT
GOWN STRL REUS W/ TWL LRG LVL3 (GOWN DISPOSABLE) ×1 IMPLANT
GOWN STRL REUS W/TWL LRG LVL3 (GOWN DISPOSABLE) ×1
IV LACTATED RINGER IRRG 3000ML (IV SOLUTION) ×4
IV LR IRRIG 3000ML ARTHROMATIC (IV SOLUTION) ×4 IMPLANT
KIT TURNOVER KIT A (KITS) ×1 IMPLANT
MANAGER SUT NOVOCUT (CUTTER) IMPLANT
MANIFOLD NEPTUNE II (INSTRUMENTS) ×1 IMPLANT
MAT ABSORB FLUID 56X50 GRAY (MISCELLANEOUS) ×2 IMPLANT
NOVOSTICH PRO MENISCAL 2-0 (Miscellaneous) ×1 IMPLANT
PACK ARTHROSCOPY KNEE (MISCELLANEOUS) ×1 IMPLANT
PAD ABD DERMACEA PRESS 5X9 (GAUZE/BANDAGES/DRESSINGS) ×2 IMPLANT
PAD WRAPON POLAR KNEE (MISCELLANEOUS) ×1 IMPLANT
PADDING CAST BLEND 6X4 STRL (MISCELLANEOUS) ×1 IMPLANT
SET Y ADAPTER MULIT-BAG IRRIG (MISCELLANEOUS) ×2 IMPLANT
SUT ETHILON 3 0 FSLX (SUTURE) ×1 IMPLANT
SYSTEM NVSTCH PRO MENISCAL 2-0 (Miscellaneous) IMPLANT
TUBING INFLOW SET DBFLO PUMP (TUBING) ×1 IMPLANT
TUBING OUTFLOW SET DBLFO PUMP (TUBING) ×1 IMPLANT
WAND WEREWOLF FLOW 90D (MISCELLANEOUS) ×1 IMPLANT
WRAPON POLAR PAD KNEE (MISCELLANEOUS) ×1

## 2023-09-07 NOTE — H&P (Signed)
Paper H&P to be scanned into permanent record. H&P reviewed. No significant changes noted.  

## 2023-09-07 NOTE — Anesthesia Procedure Notes (Signed)
Procedure Name: LMA Insertion Date/Time: 09/07/2023 11:07 AM  Performed by: Barbette Hair, CRNAPre-anesthesia Checklist: Patient identified, Emergency Drugs available, Suction available, Patient being monitored and Timeout performed Patient Re-evaluated:Patient Re-evaluated prior to induction Oxygen Delivery Method: Circle system utilized Preoxygenation: Pre-oxygenation with 100% oxygen Induction Type: IV induction LMA: LMA inserted LMA Size: 5.0 Tube type: Oral Number of attempts: 1 Placement Confirmation: positive ETCO2, CO2 detector and breath sounds checked- equal and bilateral Tube secured with: Tape Dental Injury: Teeth and Oropharynx as per pre-operative assessment

## 2023-09-07 NOTE — Op Note (Signed)
Operative Note    SURGERY DATE:09/07/2023      PRE-OP DIAGNOSIS:  1. Left medial meniscus tear 2. Left patella degenerative changes   POST-OP DIAGNOSIS:  1. Left medial meniscus tear 2. 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 Left knee arthroscopy, medial meniscus repair 2. Left knee patella and medial femoral condyle chondroplasty  SURGEON: Rosealee Albee, MD  ASSISTANT: Sonny Dandy, PA    ANESTHESIA: Gen   ESTIMATED BLOOD LOSS: minimal   TOTAL IV FLUIDS: per anesthesia   INDICATION(S): The patient is a 52 y.o. male  with clinical exam and MRI suggestive of medial meniscus tear. He had failed extensive nonoperative management. After discussion of risks, benefits, and alternatives to surgery, the patient elected to proceed.  The patient understands that there is a higher risk of re-tear with meniscus repair, but would prefer repair to maintain the normal biomechanics and structure of the knee. The patient is willing to perform the appropriate rehab and maintain weight-bearing restrictions post-operatively.   OPERATIVE FINDINGS:    Examination under anesthesia: A careful examination under anesthesia was performed.  Passive range of motion was: Hyperextension: 0.  Extension: 2.  Flexion: 140.  Lachman: normal. Pivot Shift: normal.  Posterior drawer: normal.  Varus stability in full extension: normal.  Varus stability in 30 degrees of flexion: normal.  Valgus stability in full extension: normal.  Valgus stability in 30 degrees of flexion: normal.   Intra-operative findings: A thorough arthroscopic examination of the knee was performed.  The findings are: 1. Suprapatellar pouch: Normal 2. Undersurface of median ridge: Grade 1 softening 3. Medial patellar facet: Grade 1 softening 4. Lateral patellar facet: Focal area of grade 4 chondral defect 5. Trochlea: Grade 1 changes 6. Lateral gutter/popliteus tendon: Normal 7. Hoffa's fat pad: Normal 8.  Medial gutter/plica: Normal 9. ACL: Intact  10. PCL: Normal 11. Medial meniscus: Horizontal tear of the posterior horn/body junction affecting the full width of the meniscus 12. Medial compartment cartilage: Chondral flap of the medial femoral condyle consistent with a grade 3 degenerative change measuring approximately 10 x 10 mm; normal tibial plateau 13. Lateral meniscus: Normal 14. Lateral compartment cartilage: Normal   OPERATIVE REPORT:     I identified the patient in the pre-operative holding area.  I marked the operative knee with my initials. I reviewed the risks and benefits of the proposed surgical intervention, and the patient wished to proceed. The patient was transferred to the operative suite and placed in the supine position with all bony prominences padded.  Anesthesia was administered. Appropriate IV antibiotics were administered within 30 minutes of incision. The extremity was then prepped and draped in standard fashion. A time out was performed confirming the correct extremity, correct patient, and correct procedure.   Arthroscopy portals were marked. Local anesthetic was injected to the planned portal sites. The anterolateral portal was established with an 11 blade. The arthroscope was placed in the anterolateral portal and then into the suprapatellar pouch.  The medial meniscus tear was identified. Next, the medial portal was established under needle localization. A spinal needle was used to pie-crust the MCL to allow for better visualization and protect the cartilage surfaces during instrumentation. A diagnostic knee scope was completed with the above findings.    The edges of the meniscus tear and capsule were roughened with a rasp and shaver to create a more optimal healing surface.  Ceterix Novostitch x3 all-inside sutures were passed in a haybale fashion, and  arthroscopic knots were tied for each suture, allowing for anatomic reduction. Afterwards, the meniscus was probed and  felt to be anatomically reduce and stable. A gentle chondroplasty of the medial femoral condyle and patella was performed with an oscillating shaver until there were stable cartilaginous edges. Microfracture of the intercondylar notch was then performed to allow for improved meniscus healing. Arthroscopic fluid was removed from the joint.   The portals were closed with 3-0 Nylon suture. Sterile dressings included Xeroform, 4x4s, Sof-Rol, and Bias wrap. A Polarcare was placed. A T-scope hinged knee brace was applied.  The patient was then awakened and taken to the PACU hemodynamically stable without complication.   Of note, assistance from a PA was essential to performing the surgery.  PA was present for the entire surgery.  PA assisted with patient positioning, retraction, instrumentation, and wound closure. The surgery would have been more difficult and had longer operative time without PA assistance.     POSTOPERATIVE PLAN: The patient will be discharged home today once they meet PACU criteria. Aspirin 325 mg daily was prescribed for 4 weeks for DVT prophylaxis.  Physical therapy will start on POD#3-4. FFWB x 4 weeks. F/U in 2 weeks.

## 2023-09-07 NOTE — Anesthesia Postprocedure Evaluation (Signed)
Anesthesia Post Note  Patient: Paul Holland  Procedure(s) Performed: Left knee arthroscopic meniscus repair and patellar chondroplasty (Left: Knee)  Patient location during evaluation: PACU Anesthesia Type: General Level of consciousness: awake and alert Pain management: pain level controlled Vital Signs Assessment: post-procedure vital signs reviewed and stable Respiratory status: spontaneous breathing, nonlabored ventilation, respiratory function stable and patient connected to nasal cannula oxygen Cardiovascular status: blood pressure returned to baseline and stable Postop Assessment: no apparent nausea or vomiting Anesthetic complications: no   No notable events documented.   Last Vitals:  Vitals:   09/07/23 1245 09/07/23 1300  BP: (!) 141/87   Pulse: (!) 55 65  Resp: 13   Temp:    SpO2: 100% 99%    Last Pain:  Vitals:   09/07/23 1245  TempSrc:   PainSc: 3                  Omelia Marquart C Delphia Kaylor

## 2023-09-07 NOTE — Transfer of Care (Signed)
Immediate Anesthesia Transfer of Care Note  Patient: Paul Holland  Procedure(s) Performed: Left knee arthroscopic meniscus repair and patellar chondroplasty (Left: Knee)  Patient Location: PACU  Anesthesia Type: General  Level of Consciousness: awake, alert  and patient cooperative  Airway and Oxygen Therapy: Patient Spontanous Breathing and Patient connected to supplemental oxygen  Post-op Assessment: Post-op Vital signs reviewed, Patient's Cardiovascular Status Stable, Respiratory Function Stable, Patent Airway and No signs of Nausea or vomiting  Post-op Vital Signs: Reviewed and stable  Complications: No notable events documented.

## 2023-09-18 ENCOUNTER — Other Ambulatory Visit: Payer: Self-pay

## 2023-09-18 MED ORDER — FLUTICASONE-SALMETEROL 250-50 MCG/ACT IN AEPB: 1.0000 | INHALATION_SPRAY | Freq: Two times a day (BID) | RESPIRATORY_TRACT | 11 refills | Status: DC

## 2023-10-11 ENCOUNTER — Ambulatory Visit (INDEPENDENT_AMBULATORY_CARE_PROVIDER_SITE_OTHER): Payer: Managed Care, Other (non HMO) | Admitting: Internal Medicine

## 2023-10-11 ENCOUNTER — Encounter: Payer: Self-pay | Admitting: Internal Medicine

## 2023-10-11 VITALS — BP 100/60 | HR 75 | Temp 98.7°F | Ht 70.5 in | Wt 214.0 lb

## 2023-10-11 DIAGNOSIS — Z23 Encounter for immunization: Secondary | ICD-10-CM | POA: Diagnosis not present

## 2023-10-11 DIAGNOSIS — S83207S Unspecified tear of unspecified meniscus, current injury, left knee, sequela: Secondary | ICD-10-CM

## 2023-10-11 DIAGNOSIS — J683 Other acute and subacute respiratory conditions due to chemicals, gases, fumes and vapors: Secondary | ICD-10-CM

## 2023-10-11 DIAGNOSIS — Z0000 Encounter for general adult medical examination without abnormal findings: Secondary | ICD-10-CM | POA: Diagnosis not present

## 2023-10-11 DIAGNOSIS — G629 Polyneuropathy, unspecified: Secondary | ICD-10-CM

## 2023-10-11 NOTE — Assessment & Plan Note (Signed)
Doing well with the wixela

## 2023-10-11 NOTE — Assessment & Plan Note (Signed)
Healthy Still recovering from knee surgery Colon just done---due again in 5 years Will defer PSA till 82 (had negative 2 years ago) Just had flu/COVID vaccines Td today

## 2023-10-11 NOTE — Assessment & Plan Note (Signed)
This has been managed okay by the gabapentin

## 2023-10-11 NOTE — Progress Notes (Signed)
Subjective:    Patient ID: Paul Holland, male    DOB: 1971/11/05, 52 y.o.   MRN: [id number]  HPI Here for physical  Did need meniscus repair on left knee Was sewed together--so prolonged recovery--still brace and crutches On disability/FMLA now  Breathing is fine Continues on the wixela  Has started new intermittent fasting regimen----only eats 11AM-7PM Has lost 20# from last year  Current Outpatient Medications on File Prior to Visit  Medication Sig Dispense Refill   Ascorbic Acid (VITAMIN C PO) Take by mouth.     cyclobenzaprine (FLEXERIL) 5 MG tablet TAKE 1 TABLET BY MOUTH AT BEDTIME AS NEEDED FOR MUSCLE SPASM 90 tablet 1   FIBER, GUAR GUM, PO Take by mouth.     fluticasone-salmeterol (WIXELA INHUB) 250-50 MCG/ACT AEPB Inhale 1 puff into the lungs in the morning and at bedtime. 60 each 11   gabapentin (NEURONTIN) 300 MG capsule Take 1 capsule (300 mg total) by mouth at bedtime. 90 capsule 3   GARCINIA CAMBOGIA-CHROMIUM PO Take by mouth.     GARLIC PO Take 1 capsule by mouth daily.     loratadine (CLARITIN) 10 MG tablet Take 10-20 mg by mouth daily.     Multiple Vitamin (MULTI-VITAMINS) TABS Take 1 tablet by mouth daily.     Omega-3 Fatty Acids (FISH OIL PO) Take by mouth.     OVER THE COUNTER MEDICATION BioSource Diet aid     OVER THE COUNTER MEDICATION Dual biotic     TURMERIC PO Take 1 capsule by mouth daily.     VITAMIN D, CHOLECALCIFEROL, PO Take by mouth.     No current facility-administered medications on file prior to visit.    No Known Allergies  Past Medical History:  Diagnosis Date   Allergic rhinitis due to pollen    Anxiety    Asthma    Chemical-induced asthma (HCC)    resin exposure at work   Heart murmur    probably mild MR. Sees Dr Gwen Pounds   Idiopathic peripheral neuropathy    Osteoarthritis of wrist     Past Surgical History:  Procedure Laterality Date   Cartilage removed from ribs  1981   COLONOSCOPY     KNEE ARTHROSCOPY WITH ANTERIOR  CRUCIATE LIGAMENT (ACL) REPAIR Right 6/14 & 8/14   meniscus repair and tibial fracture repair (bone graft). ACL not repaired x 3   KNEE ARTHROSCOPY WITH MENISCAL REPAIR Left 09/07/2023   Procedure: Left knee arthroscopic meniscus repair and patellar chondroplasty;  Surgeon: Signa Kell, MD;  Location: [facility];  Service: Orthopedics;  Laterality: Left;    Family History  Problem Relation Age of Onset   Hypertension Mother    Diabetes Mother    Hypertension Father    Colon polyps Father    Diabetes Brother    Hyperlipidemia Brother    Hypertension Brother    Colon cancer Paternal Uncle    Lung cancer Paternal Grandfather    Heart disease Neg Hx    Esophageal cancer Neg Hx    Inflammatory bowel disease Neg Hx    Liver disease Neg Hx    Pancreatic cancer Neg Hx    Rectal cancer Neg Hx    Stomach cancer Neg Hx     Social History   Socioeconomic History   Marital status: Married    Spouse name: Not on file   Number of children: 3   Years of education: 14   Highest education level: Some college, no degree  Occupational History   Occupation: Merchandiser, retail-- factory    Comment: Building surveyor  Tobacco Use   Smoking status: Former    Current packs/day: 0.00    Average packs/day: 0.3 packs/day for 16.9 years (4.2 ttl pk-yrs)    Types: Cigarettes    Start date: 58    Quit date: 11/04/2007    Years since quitting: 15.9    Passive exposure: Never   Smokeless tobacco: Never  Vaping Use   Vaping status: Never Used  Substance and Sexual Activity   Alcohol use: Yes    Comment: Occasionally - sporadic   Drug use: No   Sexual activity: Not on file  Other Topics Concern   Not on file  Social History Narrative   2nd marriage   3 children who live with their mother in [place]----regular visits to them.   Lives in a 2 story home.     Education: some college.  Works as a Merchandiser, retail for a [employer].     Left Handed   Drinks Caffeine    Social Determinants of  Health   Financial Resource Strain: Low Risk  (06/20/2023)   Overall Financial Resource Strain (CARDIA)    Difficulty of Paying Living Expenses: Not hard at all  Food Insecurity: No Food Insecurity (06/20/2023)   Hunger Vital Sign    Worried About Running Out of Food in the Last Year: Never true    Ran Out of Food in the Last Year: Never true  Transportation Needs: No Transportation Needs (06/20/2023)   PRAPARE - Administrator, Civil Service (Medical): No    Lack of Transportation (Non-Medical): No  Physical Activity: Insufficiently Active (06/20/2023)   Exercise Vital Sign    Days of Exercise per Week: 4 days    Minutes of Exercise per Session: 30 min  Stress: No Stress Concern Present (06/20/2023)   [employer] of Occupational Health - Occupational Stress Questionnaire    Feeling of Stress : Only a little  Social Connections: Unknown (06/20/2023)   Social Connection and Isolation Panel [NHANES]    Frequency of Communication with Friends and Family: Once a week    Frequency of Social Gatherings with Friends and Family: Patient declined    Attends Religious Services: Never    Database administrator or Organizations: No    Attends Engineer, structural: Not on file    Marital Status: Married  Catering manager Violence: Not on file     Review of Systems  Constitutional:  Negative for fatigue.       Wears seat belt Still not driving yet--post op  HENT:  Positive for tinnitus. Negative for hearing loss.        Gets yearly hearing tests/wears protection Recent crown  Eyes:  Negative for visual disturbance.       No diplopia or unilateral vision loss  Respiratory:  Negative for cough, chest tightness, shortness of breath and wheezing.   Cardiovascular:  Negative for chest pain, palpitations and leg swelling.  Gastrointestinal:  Negative for constipation.       Occ blood paper or bowl No heartburn  Endocrine: Negative for polydipsia and polyuria.   Genitourinary:        Mild changes in stream No ED  Musculoskeletal:  Positive for back pain. Negative for joint swelling.       Gets intermittent dry needling in back which helps pain  Skin:  Negative for rash.  Allergic/Immunologic: Positive for environmental allergies. Negative for immunocompromised state.  Takes the loratadine daily Is sensitive to something at work--and hay fever  Neurological:  Negative for dizziness, syncope and light-headedness.       Occ headache  Hematological:  Negative for adenopathy. Does not bruise/bleed easily.  Psychiatric/Behavioral:  Negative for dysphoric mood and sleep disturbance. The patient is not nervous/anxious.        Objective:   Physical Exam Constitutional:      Appearance: Normal appearance.  HENT:     Mouth/Throat:     Pharynx: No oropharyngeal exudate or posterior oropharyngeal erythema.  Eyes:     Conjunctiva/sclera: Conjunctivae normal.     Pupils: Pupils are equal, round, and reactive to light.  Cardiovascular:     Rate and Rhythm: Normal rate and regular rhythm.     Pulses: Normal pulses.     Heart sounds: No murmur heard.    No gallop.  Pulmonary:     Effort: Pulmonary effort is normal.     Breath sounds: Normal breath sounds. No wheezing or rales.  Abdominal:     Palpations: Abdomen is soft.     Tenderness: There is no abdominal tenderness.  Musculoskeletal:     Cervical back: Neck supple.     Right lower leg: No edema.     Left lower leg: No edema.  Lymphadenopathy:     Cervical: No cervical adenopathy.  Skin:    Findings: No lesion or rash.  Neurological:     General: No focal deficit present.     Mental Status: He is alert and oriented to person, place, and time.  Psychiatric:        Mood and Affect: Mood normal.        Behavior: Behavior normal.            Assessment & Plan:

## 2023-10-11 NOTE — Assessment & Plan Note (Signed)
Still recovering from surgery No pain though

## 2023-10-11 NOTE — Addendum Note (Signed)
Addended by: Eual Fines on: 10/11/2023 04:16 PM   Modules accepted: Orders

## 2023-10-12 LAB — CBC
HCT: 48.3 % (ref 39.0–52.0)
Hemoglobin: 16.5 g/dL (ref 13.0–17.0)
MCHC: 34.2 g/dL (ref 30.0–36.0)
MCV: 96.4 fL (ref 78.0–100.0)
Platelets: 282 10*3/uL (ref 150.0–400.0)
RBC: 5.01 Mil/uL (ref 4.22–5.81)
RDW: 13.2 % (ref 11.5–15.5)
WBC: 7.4 10*3/uL (ref 4.0–10.5)

## 2023-10-12 LAB — LIPID PANEL
Cholesterol: 186 mg/dL (ref 0–200)
HDL: 38.8 mg/dL — ABNORMAL LOW (ref >39.00)
LDL Cholesterol: 108 mg/dL — ABNORMAL HIGH (ref 0–99)
NonHDL: 146.88
Total CHOL/HDL Ratio: 5
Triglycerides: 195 mg/dL — ABNORMAL HIGH (ref 0.0–149.0)
VLDL: 39 mg/dL (ref 0.0–40.0)

## 2023-10-12 LAB — COMPREHENSIVE METABOLIC PANEL
ALT: 23 U/L (ref 0–53)
AST: 17 U/L (ref 0–37)
Albumin: 4.6 g/dL (ref 3.5–5.2)
Alkaline Phosphatase: 72 U/L (ref 39–117)
BUN: 16 mg/dL (ref 6–23)
CO2: 32 meq/L (ref 19–32)
Calcium: 9.5 mg/dL (ref 8.4–10.5)
Chloride: 102 meq/L (ref 96–112)
Creatinine, Ser: 0.95 mg/dL (ref 0.40–1.50)
GFR: 91.72 mL/min (ref >60.00)
Glucose, Bld: 92 mg/dL (ref 70–99)
Potassium: 4.5 meq/L (ref 3.5–5.1)
Sodium: 142 meq/L (ref 135–145)
Total Bilirubin: 0.5 mg/dL (ref 0.2–1.2)
Total Protein: 7.2 g/dL (ref 6.0–8.3)

## 2023-12-19 ENCOUNTER — Other Ambulatory Visit: Payer: Self-pay | Admitting: Internal Medicine

## 2023-12-19 NOTE — Telephone Encounter (Signed)
 Refill request for GABAPENTIN  CAPS 300MG    LOV - 10/11/23 Next OV - not scheduled Last refill - 12/20/22 #90/3

## 2024-03-13 ENCOUNTER — Other Ambulatory Visit: Payer: Self-pay | Admitting: Orthopedic Surgery

## 2024-03-13 DIAGNOSIS — S83232D Complex tear of medial meniscus, current injury, left knee, subsequent encounter: Secondary | ICD-10-CM

## 2024-03-14 ENCOUNTER — Encounter: Payer: Self-pay | Admitting: Orthopedic Surgery

## 2024-03-16 ENCOUNTER — Other Ambulatory Visit

## 2024-03-31 ENCOUNTER — Ambulatory Visit
Admission: RE | Admit: 2024-03-31 | Discharge: 2024-03-31 | Disposition: A | Discharge status: Home / Self Care | Source: Ambulatory Visit | Attending: Orthopedic Surgery

## 2024-03-31 DIAGNOSIS — S83232D Complex tear of medial meniscus, current injury, left knee, subsequent encounter: Secondary | ICD-10-CM

## 2024-04-07 ENCOUNTER — Other Ambulatory Visit: Payer: Self-pay | Admitting: Orthopedic Surgery

## 2024-04-11 ENCOUNTER — Inpatient Hospital Stay: Admission: RE | Admit: 2024-04-11 | Source: Ambulatory Visit

## 2024-04-14 ENCOUNTER — Other Ambulatory Visit: Payer: Self-pay

## 2024-04-14 ENCOUNTER — Encounter
Admission: RE | Admit: 2024-04-14 | Discharge: 2024-04-14 | Disposition: A | Discharge status: Home / Self Care | Source: Ambulatory Visit | Attending: Orthopedic Surgery | Admitting: Orthopedic Surgery

## 2024-04-14 NOTE — Patient Instructions (Signed)
 Your procedure is scheduled on: Friday 04/18/24 To find out your arrival time, please call [phone] between 1PM - 3PM on:   Thursday 04/17/24 Report to the Registration Desk on the 1st floor of the [facility]. Free Valet parking is available.  If your arrival time is 6:00 am, do not arrive before that time as the [facility] entrance doors do not open until 6:00 am.  REMEMBER: Instructions that are not followed completely may result in serious medical risk, up to and including death; or upon the discretion of your surgeon and anesthesiologist your surgery may need to be rescheduled.  Do not eat food after midnight the night before surgery.  No gum chewing or hard candies.  You may however, drink CLEAR liquids up to 2 hours before you are scheduled to arrive for your surgery. Do not drink anything within 2 hours of your scheduled arrival time.  Clear liquids include: - water  - apple juice without pulp - gatorade (not RED colors) - black coffee or tea (Do NOT add milk or creamers to the coffee or tea) Do NOT drink anything that is not on this list.  Type 1 and Type 2 diabetics should only drink water.  One week prior to surgery: Stop Anti-inflammatories (NSAIDS) such as Advil , Aleve, Ibuprofen , Motrin , Naproxen, Naprosyn and Aspirin  based products such as Excedrin, Goody's Powder, BC Powder. You may however, continue to take Tylenol  if needed for pain up until the day of surgery.  Stop ANY OVER THE COUNTER supplements and vitamins until after surgery.  Continue taking all prescribed medications.   TAKE ONLY THESE MEDICATIONS THE MORNING OF SURGERY WITH A SIP OF WATER:  loratadine (CLARITIN) 10 MG tablet   Use inhalers on the day of surgery. Aaron Aas  No Alcohol for 24 hours before or after surgery.  No Smoking including e-cigarettes for 24 hours before surgery.  No chewable tobacco products for at least 6 hours before surgery.  No nicotine patches on the day of surgery.  Do  not use any "recreational" drugs for at least a week (preferably 2 weeks) before your surgery.  Please be advised that the combination of cocaine and anesthesia may have negative outcomes, up to and including death. If you test positive for cocaine, your surgery will be cancelled.  On the morning of surgery brush your teeth with toothpaste and water, you may rinse your mouth with mouthwash if you wish. Do not swallow any toothpaste or mouthwash.  Use CHG Soap or wipes as directed on instruction sheet.  Do not wear lotions, powders, or perfumes/cologne.   Do not shave body hair from the neck down 48 hours before surgery.  Wear comfortable clothing (specific to your surgery type) to the hospital.  Do not wear jewelry, make-up, hairpins, clips or nail polish.  For welded (permanent) jewelry: bracelets, anklets, waist bands, etc.  Please have this removed prior to surgery.  If it is not removed, there is a chance that hospital personnel will need to cut it off on the day of surgery. Contact lenses, hearing aids and dentures may not be worn into surgery.  Do not bring valuables to the hospital. [facility] is not responsible for any missing/lost belongings or valuables.   Notify your doctor if there is any change in your medical condition (cold, fever, infection).  If you are being discharged the day of surgery, you will not be allowed to drive home. You will need a responsible individual to drive you home and  stay with you for 24 hours after surgery.   After surgery, you can help prevent lung complications by doing breathing exercises.  Take deep breaths and cough every 1-2 hours. Your doctor may order a device called an Incentive Spirometer to help you take deep breaths.  Surgery Visitation Policy:  Patients undergoing a surgery or procedure may have two family members or support persons with them as long as the person is not COVID-19 positive or experiencing its symptoms.   Please  call the Pre-admissions Testing Dept. at [phone] if you have any questions about these instructions.     Preparing for Surgery with CHLORHEXIDINE GLUCONATE (CHG) Soap  Chlorhexidine Gluconate (CHG) Soap  o An antiseptic cleaner that kills germs and bonds with the skin to continue killing germs even after washing  o Used for showering the night before surgery and morning of surgery  Before surgery, you can play an important role by reducing the number of germs on your skin.  CHG (Chlorhexidine gluconate) soap is an antiseptic cleanser which kills germs and bonds with the skin to continue killing germs even after washing.  Please do not use if you have an allergy to CHG or antibacterial soaps. If your skin becomes reddened/irritated stop using the CHG.  1. Shower the NIGHT BEFORE SURGERY and the MORNING OF SURGERY with CHG soap.  2. If you choose to wash your hair, wash your hair first as usual with your normal shampoo.  3. After shampooing, rinse your hair and body thoroughly to remove the shampoo.  4. Use CHG as you would any other liquid soap. You can apply CHG directly to the skin and wash gently with a scrungie or a clean washcloth.  5. Apply the CHG soap to your body only from the neck down. Do not use on open wounds or open sores. Avoid contact with your eyes, ears, mouth, and genitals (private parts). Wash face and genitals (private parts) with your normal soap.  6. Wash thoroughly, paying special attention to the area where your surgery will be performed.  7. Thoroughly rinse your body with warm water.  8. Do not shower/wash with your normal soap after using and rinsing off the CHG soap.  9. Pat yourself dry with a clean towel.  10. Wear clean pajamas to bed the night before surgery.  12. Place clean sheets on your bed the night of your first shower and do not sleep with pets.  13. Shower again with the CHG soap on the day of surgery prior to arriving at the  hospital.  14. Do not apply any deodorants/lotions/powders.  15. Please wear clean clothes to the hospital.

## 2024-04-18 ENCOUNTER — Ambulatory Visit

## 2024-04-18 ENCOUNTER — Encounter
Admission: RE | Disposition: A | Discharge status: Home / Self Care | Payer: Self-pay | Source: Home / Self Care | Attending: Orthopedic Surgery

## 2024-04-18 ENCOUNTER — Other Ambulatory Visit: Payer: Self-pay

## 2024-04-18 ENCOUNTER — Encounter: Payer: Self-pay | Admitting: Orthopedic Surgery

## 2024-04-18 ENCOUNTER — Ambulatory Visit
Admission: RE | Admit: 2024-04-18 | Discharge: 2024-04-18 | Disposition: A | Discharge status: Home / Self Care | Attending: Orthopedic Surgery | Admitting: Orthopedic Surgery

## 2024-04-18 DIAGNOSIS — Z87891 Personal history of nicotine dependence: Secondary | ICD-10-CM | POA: Diagnosis not present

## 2024-04-18 DIAGNOSIS — S83242A Other tear of medial meniscus, current injury, left knee, initial encounter: Secondary | ICD-10-CM | POA: Diagnosis not present

## 2024-04-18 DIAGNOSIS — M1712 Unilateral primary osteoarthritis, left knee: Secondary | ICD-10-CM | POA: Diagnosis not present

## 2024-04-18 DIAGNOSIS — S83232D Complex tear of medial meniscus, current injury, left knee, subsequent encounter: Secondary | ICD-10-CM | POA: Diagnosis present

## 2024-04-18 DIAGNOSIS — X58XXXA Exposure to other specified factors, initial encounter: Secondary | ICD-10-CM | POA: Diagnosis not present

## 2024-04-18 HISTORY — PX: KNEE ARTHROSCOPY WITH MEDIAL MENISECTOMY: SHX5651

## 2024-04-18 SURGERY — ARTHROSCOPY, KNEE, WITH MEDIAL MENISCECTOMY
Anesthesia: General | Site: Knee | Laterality: Left

## 2024-04-18 MED ORDER — LACTATED RINGERS IV SOLN
INTRAVENOUS | Status: DC
Start: 2024-04-18 — End: 2024-04-18

## 2024-04-18 MED ORDER — BUPIVACAINE HCL (PF) 0.5 % IJ SOLN
INTRAMUSCULAR | Status: AC
  Filled 2024-04-18: qty 30

## 2024-04-18 MED ORDER — FENTANYL CITRATE (PF) 100 MCG/2ML IJ SOLN
INTRAMUSCULAR | Status: AC
  Filled 2024-04-18: qty 2

## 2024-04-18 MED ORDER — PROPOFOL 10 MG/ML IV BOLUS
INTRAVENOUS | Status: AC
  Filled 2024-04-18: qty 20

## 2024-04-18 MED ORDER — IBUPROFEN 800 MG PO TABS
800.0000 mg | ORAL_TABLET | Freq: Three times a day (TID) | ORAL | 0 refills | Status: AC
Start: 2024-04-18 — End: 2024-05-02

## 2024-04-18 MED ORDER — ACETAMINOPHEN 10 MG/ML IV SOLN: 1000.0000 mg | Freq: Once | INTRAVENOUS | Status: DC | PRN

## 2024-04-18 MED ORDER — LIDOCAINE HCL (CARDIAC) PF 100 MG/5ML IV SOSY
PREFILLED_SYRINGE | INTRAVENOUS | Status: DC | PRN
  Administered 2024-04-18: 100 mg via INTRAVENOUS

## 2024-04-18 MED ORDER — ACETAMINOPHEN 500 MG PO TABS
1000.0000 mg | ORAL_TABLET | Freq: Three times a day (TID) | ORAL | 2 refills | Status: DC
Start: 2024-04-18 — End: 2024-05-27

## 2024-04-18 MED ORDER — RINGERS IRRIGATION IR SOLN
Status: DC | PRN
  Administered 2024-04-18: 3000 mL

## 2024-04-18 MED ORDER — ORAL CARE MOUTH RINSE: 15.0000 mL | Freq: Once | OROMUCOSAL | Status: AC

## 2024-04-18 MED ORDER — LACTATED RINGERS IV SOLN
INTRAVENOUS | Status: DC | PRN
  Administered 2024-04-18: 3001 mL

## 2024-04-18 MED ORDER — CHLORHEXIDINE GLUCONATE 0.12 % MT SOLN
OROMUCOSAL | Status: AC
  Filled 2024-04-18: qty 15

## 2024-04-18 MED ORDER — ACETAMINOPHEN 10 MG/ML IV SOLN
INTRAVENOUS | Status: AC
  Filled 2024-04-18: qty 100

## 2024-04-18 MED ORDER — BUPIVACAINE HCL 0.5 % IJ SOLN
INTRAMUSCULAR | Status: DC | PRN
  Administered 2024-04-18: 10 mL
  Administered 2024-04-18: 2 mL

## 2024-04-18 MED ORDER — HYDROCODONE-ACETAMINOPHEN 5-325 MG PO TABS: 1.0000 | ORAL_TABLET | ORAL | 0 refills | Status: DC | PRN

## 2024-04-18 MED ORDER — MIDAZOLAM HCL 2 MG/2ML IJ SOLN
INTRAMUSCULAR | Status: DC | PRN
  Administered 2024-04-18: 2 mg via INTRAVENOUS

## 2024-04-18 MED ORDER — ACETAMINOPHEN 10 MG/ML IV SOLN
INTRAVENOUS | Status: DC | PRN
  Administered 2024-04-18: 1000 mg via INTRAVENOUS

## 2024-04-18 MED ORDER — ONDANSETRON 4 MG PO TBDP: 4.0000 mg | ORAL_TABLET | Freq: Three times a day (TID) | ORAL | 0 refills | Status: AC | PRN

## 2024-04-18 MED ORDER — ONDANSETRON HCL 4 MG/2ML IJ SOLN
INTRAMUSCULAR | Status: DC | PRN
  Administered 2024-04-18: 4 mg via INTRAVENOUS

## 2024-04-18 MED ORDER — FENTANYL CITRATE (PF) 100 MCG/2ML IJ SOLN
25.0000 ug | INTRAMUSCULAR | Status: DC | PRN
Start: 2024-04-18 — End: 2024-04-18

## 2024-04-18 MED ORDER — CHLORHEXIDINE GLUCONATE 0.12 % MT SOLN
15.0000 mL | Freq: Once | OROMUCOSAL | Status: AC
  Administered 2024-04-18: 15 mL via OROMUCOSAL

## 2024-04-18 MED ORDER — ASPIRIN 325 MG PO TBEC
325.0000 mg | DELAYED_RELEASE_TABLET | Freq: Every day | ORAL | 0 refills | Status: AC
Start: 2024-04-18 — End: 2024-05-02

## 2024-04-18 MED ORDER — DEXAMETHASONE SODIUM PHOSPHATE 10 MG/ML IJ SOLN
INTRAMUSCULAR | Status: AC
  Filled 2024-04-18: qty 1

## 2024-04-18 MED ORDER — DEXAMETHASONE SODIUM PHOSPHATE 10 MG/ML IJ SOLN
INTRAMUSCULAR | Status: DC | PRN
  Administered 2024-04-18: 5 mg via INTRAVENOUS

## 2024-04-18 MED ORDER — ONDANSETRON HCL 4 MG/2ML IJ SOLN: 4.0000 mg | Freq: Once | INTRAMUSCULAR | Status: DC | PRN

## 2024-04-18 MED ORDER — MIDAZOLAM HCL 2 MG/2ML IJ SOLN
INTRAMUSCULAR | Status: AC
  Filled 2024-04-18: qty 2

## 2024-04-18 MED ORDER — PROPOFOL 10 MG/ML IV BOLUS
INTRAVENOUS | Status: DC | PRN
Start: 2024-04-18 — End: 2024-04-18
  Administered 2024-04-18: 200 mg via INTRAVENOUS

## 2024-04-18 MED ORDER — CEFAZOLIN SODIUM-DEXTROSE 2-4 GM/100ML-% IV SOLN
2.0000 g | INTRAVENOUS | Status: AC
  Administered 2024-04-18: 2 g via INTRAVENOUS

## 2024-04-18 MED ORDER — CEFAZOLIN SODIUM-DEXTROSE 2-4 GM/100ML-% IV SOLN
INTRAVENOUS | Status: AC
  Filled 2024-04-18: qty 100

## 2024-04-18 MED ORDER — OXYCODONE HCL 5 MG PO TABS: 5.0000 mg | ORAL_TABLET | Freq: Once | ORAL | Status: DC | PRN

## 2024-04-18 MED ORDER — EPINEPHRINE PF 1 MG/ML IJ SOLN
INTRAMUSCULAR | Status: AC
  Filled 2024-04-18: qty 1

## 2024-04-18 MED ORDER — FENTANYL CITRATE (PF) 100 MCG/2ML IJ SOLN
INTRAMUSCULAR | Status: DC | PRN
Start: 2024-04-18 — End: 2024-04-18
  Administered 2024-04-18 (×2): 50 ug via INTRAVENOUS

## 2024-04-18 MED ORDER — OXYCODONE HCL 5 MG/5ML PO SOLN: 5.0000 mg | Freq: Once | ORAL | Status: DC | PRN

## 2024-04-18 MED ORDER — ONDANSETRON HCL 4 MG/2ML IJ SOLN
INTRAMUSCULAR | Status: AC
  Filled 2024-04-18: qty 2

## 2024-04-18 MED ORDER — LIDOCAINE-EPINEPHRINE 1 %-1:100000 IJ SOLN
INTRAMUSCULAR | Status: AC
  Filled 2024-04-18: qty 1

## 2024-04-18 SURGICAL SUPPLY — 37 items
ADAPTER IRRIG TUBE 2 SPIKE SOL (ADAPTER) ×2 IMPLANT
BLADE FULL RADIUS 3.5 (BLADE) IMPLANT
BLADE SURG SZ11 CARB STEEL (BLADE) ×1 IMPLANT
BNDG COHESIVE 6X5 TAN ST LF (GAUZE/BANDAGES/DRESSINGS) ×1 IMPLANT
BNDG ESMARCH 6X12 STRL LF (GAUZE/BANDAGES/DRESSINGS) ×1 IMPLANT
BNDG GAUZE DERMACEA FLUFF 4 (GAUZE/BANDAGES/DRESSINGS) IMPLANT
CHLORAPREP W/TINT 26 (MISCELLANEOUS) ×1 IMPLANT
COOLER POLAR GLACIER W/PUMP (MISCELLANEOUS) ×1 IMPLANT
COVER LIGHT HANDLE STERIS (MISCELLANEOUS) ×2 IMPLANT
CUFF TRNQT CYL 34X4.125X (TOURNIQUET CUFF) IMPLANT
DEVICE SUCT BLK HOLE OR FLOOR (MISCELLANEOUS) ×1 IMPLANT
DRAPE ARTHROSCOPY W/POUCH 90 (DRAPES) ×2 IMPLANT
DRAPE IMP U-DRAPE 54X76 (DRAPES) ×1 IMPLANT
DRSG XEROFORM 1X8 (GAUZE/BANDAGES/DRESSINGS) IMPLANT
GAUZE SPONGE 4X4 12PLY STRL (GAUZE/BANDAGES/DRESSINGS) IMPLANT
GLOVE BIOGEL PI IND STRL 8 (GLOVE) ×1 IMPLANT
GLOVE ORTHO TXT STRL SZ7.5 (GLOVE) ×1 IMPLANT
GLOVE SURG ORTHO 8.0 STRL STRW (GLOVE) ×2 IMPLANT
GOWN SRG LRG LVL 4 IMPRV REINF (GOWNS) ×1 IMPLANT
GOWN SRG XL LVL 3 NONREINFORCE (GOWNS) ×2 IMPLANT
IV LR IRRIG 3000ML ARTHROMATIC (IV SOLUTION) ×4 IMPLANT
KIT TURNOVER KIT A (KITS) ×1 IMPLANT
MANIFOLD NEPTUNE II (INSTRUMENTS) ×2 IMPLANT
MAT ABSORB FLUID 56X50 GRAY (MISCELLANEOUS) ×2 IMPLANT
PACK ARTHROSCOPY KNEE (MISCELLANEOUS) ×1 IMPLANT
PAD ABD DERMACEA PRESS 5X9 (GAUZE/BANDAGES/DRESSINGS) IMPLANT
PAD WRAPON POLAR KNEE (MISCELLANEOUS) ×1 IMPLANT
PADDING CAST COTTON 6X4 STRL (CAST SUPPLIES) IMPLANT
SUT VIC AB 2-0 CT2 27 (SUTURE) ×1 IMPLANT
SUTURE EHLN 3-0 FS-10 30 BLK (SUTURE) ×1 IMPLANT
SUTURE MNCRL 4-0 27XMF (SUTURE) ×1 IMPLANT
TOWEL OR 17X26 4PK STRL BLUE (TOWEL DISPOSABLE) ×2 IMPLANT
TRAP FLUID SMOKE EVACUATOR (MISCELLANEOUS) ×1 IMPLANT
TUBE SET DOUBLEFLO INFLOW (TUBING) ×1 IMPLANT
TUBE SET DOUBLEFLO OUTFLOW (TUBING) ×1 IMPLANT
WAND WEREWOLF FLOW 90D (MISCELLANEOUS) IMPLANT
WATER STERILE IRR 500ML POUR (IV SOLUTION) ×1 IMPLANT

## 2024-04-18 NOTE — Op Note (Signed)
 Operative Note    SURGERY DATE: 04/18/2024   PRE-OP DIAGNOSIS:  1. Left recurrent medial meniscus tear 2. 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 Left recurrent medial meniscus tear 2. 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  Left knee arthroscopy, partial medial meniscectomy 2.  Left knee chondroplasty of the medial compartment   SURGEON: Cleotilde Dago, MD   ANESTHESIA: Gen   ESTIMATED BLOOD LOSS: minimal   TOTAL IV FLUIDS: per anesthesia   INDICATION(S):  Paul Holland is a 53 y.o. male who initially underwent left knee meniscus repair of a horizontal tear of the posterior horn/body region by me on 09/07/2023. Clinical exam and repeat MRI were concerning for meniscus re- tear.  After discussion of risks, benefits, and alternatives to surgery, the patient elected to proceed.   OPERATIVE FINDINGS:    Examination under anesthesia: A careful examination under anesthesia was performed.  Passive range of motion was: Hyperextension: 0.  Extension: 2.  Flexion: 140.  Lachman: normal. Pivot Shift: normal.  Posterior drawer: normal.  Varus stability in full extension: normal.  Varus stability in 30 degrees of flexion: normal.  Valgus stability in full extension: normal.  Valgus stability in 30 degrees of flexion: normal.   Intra-operative findings: A thorough arthroscopic examination of the knee was performed.  The findings are: 1. Suprapatellar pouch: Normal 2. Undersurface of median ridge: Grade 1 softening 3. Medial patellar facet: Grade 1 softening 4. Lateral patellar facet: Focal area of grade 4 chondral defect 5. Trochlea: Grade 1 changes 6. Lateral gutter/popliteus tendon: Normal 7. Hoffa's fat pad: Normal 8. Medial gutter/plica: Normal 9. ACL: Intact  10. PCL: Normal 11. Medial meniscus: Horizontal tear of the posterior horn/body junction affecting the full width of the meniscus 12.  Medial compartment cartilage: Grade 3 degenerative change measuring approximately 10 x 10 mm adjacent to the intercondylar notch; grade 1-2 degenerative changes to the tibial plateau 13. Lateral meniscus: Normal 14. Lateral compartment cartilage: Grade 1 softening of the tibial plateau; normal femoral condyle   OPERATIVE REPORT:     I identified Zylan H Grabski in the pre-operative holding area. I marked the operative knee with my initials. I reviewed the risks and benefits of the proposed surgical intervention and the patient wished to proceed. The patient was transferred to the operative suite and placed in the supine position with all bony prominences padded.  Anesthesia was administered. Appropriate IV antibiotics were administered prior to incision. The extremity was then prepped and draped in standard fashion. A time out was performed confirming the correct extremity, correct patient, and correct procedure.   Arthroscopy portals were marked. Local anesthetic was injected to the planned portal sites. The anterolateral portal was established with an 11 blade.      The arthroscope was placed in the anterolateral portal and then into the suprapatellar pouch. Next, the medial portal was established under needle localization. A diagnostic knee scope was completed with the above findings. The medial meniscus tear was identified.   The MCL was pie-crusted to improve visualization of the posterior horn.  Loose suture was removed.  The meniscal tear was debrided using an arthroscopic biter and an oscillating shaver until the meniscus had stable borders.  This consisted of removing the inferior leaflet.  A chondroplasty was performed of the medial femoral condyle such that there were stable cartilage edges without any loose fragments of cartilage. Arthroscopic fluid was  removed from the joint.   The portals were closed with 3-0 Nylon suture. Sterile dressings included Xeroform, 4x4s, Sof-Rol, and Bias wrap.  A Polarcare was placed.  The patient was then awakened and taken to the PACU hemodynamically stable without complication.   POSTOPERATIVE PLAN: The patient will be discharged home today once they meet PACU criteria. Aspirin  325 mg daily was prescribed for 2 weeks for DVT prophylaxis.  Physical therapy will start on POD#3-4. Weight-bearing as tolerated. Follow up in 2 weeks per protocol.

## 2024-04-18 NOTE — Discharge Instructions (Addendum)
Arthroscopic Knee Surgery - Partial Meniscectomy   Post-Op Instructions   1. Bracing or crutches: Crutches will be provided at the time of discharge from the surgery center if you do not already have them.   2. Ice: You may be provided with a device [facility]) that allows you to ice the affected area effectively. Otherwise you can ice manually.    3. Driving:  Plan on not driving for at least two weeks. Please note that you are advised NOT to drive while taking narcotic pain medications as you may be impaired and unsafe to drive.   4. Activity: Ankle pumps several times an hour while awake to prevent blood clots. Weight bearing: as tolerated. Use crutches for as needed (usually ~1 week or less) until pain allows you to ambulate without a limp. Bending and straightening the knee is unlimited. Elevate knee above heart level as much as possible for one week. Avoid standing more than 5 minutes (consecutively) for the first week.  Avoid long distance travel for 2 weeks.  5. Medications:  - You have been provided a prescription for narcotic pain medicine. After surgery, take 1-2 narcotic tablets every 4 hours if needed for severe pain.  - You may take up to 3000mg /day of tylenol (acetaminophen). You can take 1000mg  3x/day. Please check your narcotic. If you have acetaminophen in your narcotic (each tablet will be 325mg ), be careful not to exceed a total of 3000mg /day of acetaminophen.  - A prescription for anti-nausea medication will be provided in case the narcotic medicine or anesthesia causes nausea - take 1 tablet every 6 hours only if nauseated.  - Take ibuprofen 800 mg every 8 hours WITH food to reduce post-operative knee swelling. DO NOT STOP IBUPROFEN POST-OP UNTIL INSTRUCTED TO DO SO at first post-op office visit (10-14 days after surgery). However, please discontinue if you have any abdominal discomfort after taking this.  - Take enteric coated aspirin 325 mg once daily for 2 weeks to prevent  blood clots.    6. Bandages: The physical therapist should change the bandages at the first post-op appointment. If needed, the dressing supplies have been provided to you.   7. Physical Therapy: 1-2 times per week for 6 weeks. Therapy typically starts on post operative Day 3 or 4. You have been provided an order for physical therapy. The therapist will provide home exercises.   8. Work: May do light duty/desk job in approximately 1-2 weeks when off of narcotics, pain is well-controlled, and swelling has decreased. Labor intensive jobs may require 4-6 weeks to return.      9. Post-Op Appointments: Your first post-op appointment will be with Dr. in approximately 2 weeks time.    If you find that they have not been scheduled please call the Orthopaedic Appointment front desk at [phone]. POLAR CARE INFORMATION   How to use [facility] Polar [facility] Therapy System?  YouTube   Allena Katz  OPERATING INSTRUCTIONS  Start the product With dry hands, connect the transformer to the electrical connection located on the top of the cooler. Next, plug the transformer into an appropriate electrical outlet. The unit will automatically start running at this point.  To stop the pump, disconnect electrical power.  Unplug to stop the product when not in use. Unplugging the Polar Care unit turns it off. Always unplug immediately after use. Never leave it plugged in while unattended. Remove pad.    FIRST ADD WATER TO FILL LINE, THEN ICE---Replace ice when  existing ice is almost melted  1 Discuss Treatment with your Licensed Health Care Practitioner and Use Only as Prescribed 2 Apply Insulation Barrier & Cold Therapy Pad 3 Check for Moisture 4 Inspect Skin Regularly  Tips and Trouble Shooting Usage Tips 1. Use cubed or chunked ice for optimal performance. 2. It is recommended to drain the Pad between uses. To drain the pad, hold the Pad  upright with the hose pointed toward the ground. Depress the black plunger and allow water to drain out. 3. You may disconnect the Pad from the unit without removing the pad from the affected area by depressing the silver tabs on the hose coupling and gently pulling the hoses apart. The Pad and unit will seal itself and will not leak. Note: Some dripping during release is normal. 4. DO NOT RUN PUMP WITHOUT WATER! The pump in this unit is designed to run with water. Running the unit without water will cause permanent damage to the pump. 5. Unplug unit before removing lid.  TROUBLESHOOTING GUIDE Pump not running, Water not flowing to the pad, Pad is not getting cold 1. Make sure the transformer is plugged into the wall outlet. 2. Confirm that the ice and water are filled to the indicated levels. 3. Make sure there are no kinks in the pad. 4. Gently pull on the blue tube to make sure the tube/pad junction is straight. 5. Remove the pad from the treatment site and ll it while the pad is lying at; then reapply. 6. Confirm that the pad couplings are securely attached to the unit. Listen for the double clicks (Figure 1) to confirm the pad couplings are securely attached.  Leaks    Note: Some condensation on the lines, controller, and pads is unavoidable, especially in warmer climates. 1. If using a [employer] Polar Care Cold Therapy unit with a detachable Cold Therapy Pad, and a leak exists (other than condensation on the lines) disconnect the pad couplings. Make sure the silver tabs on the couplings are depressed before reconnecting the pad to the pump hose; then confirm both sides of the coupling are properly clicked in. 2. If the coupling continues to leak or a leak is detected in the pad itself, stop using it and call [employer] Customer Care at [phone].  Cleaning After use, empty and dry the unit with a soft cloth. Warm water and mild detergent may be used occasionally to clean the pump and  tubes.  WARNING: The Polar Care Cube can be cold enough to cause serious injury, including full skin necrosis. Follow these Operating Instructions, and carefully read the Product Insert (see pouch on side of unit) and the Cold Therapy Pad Fitting Instructions (provided with each Cold Therapy Pad) prior to use.

## 2024-04-18 NOTE — Anesthesia Procedure Notes (Signed)
 Procedure Name: LMA Insertion Date/Time: 04/18/2024 12:28 PM  Performed by: Juanda Noon, CRNAPre-anesthesia Checklist: Patient identified, Emergency Drugs available, Suction available and Patient being monitored Patient Re-evaluated:Patient Re-evaluated prior to induction Oxygen Delivery Method: Circle system utilized Preoxygenation: Pre-oxygenation with 100% oxygen Induction Type: IV induction Ventilation: Mask ventilation without difficulty LMA: LMA inserted LMA Size: 5.0 Tube type: Oral Number of attempts: 1 Airway Equipment and Method: Stylet and Oral airway Placement Confirmation: positive ETCO2 and breath sounds checked- equal and bilateral Tube secured with: Tape Dental Injury: Teeth and Oropharynx as per pre-operative assessment

## 2024-04-18 NOTE — Transfer of Care (Signed)
 Immediate Anesthesia Transfer of Care Note  Patient: Paul Holland  Procedure(s) Performed: ARTHROSCOPY, KNEE, WITH MEDIAL MENISCECTOMY (Left: Knee)  Patient Location: PACU  Anesthesia Type:General  Level of Consciousness: drowsy  Airway & Oxygen Therapy: Patient Spontanous Breathing and Patient connected to face mask oxygen  Post-op Assessment: Report given to RN and Post -op Vital signs reviewed and stable  Post vital signs: Reviewed and stable  Last Vitals:  Vitals Value Taken Time  BP 120/66 04/18/24 1324  Temp    Pulse 82 04/18/24 1328  Resp 20 04/18/24 1328  SpO2 100 % 04/18/24 1328  Vitals shown include unfiled device data.  Last Pain:  Vitals:   04/18/24 1123  TempSrc: Temporal  PainSc: 0-No pain         Complications: No notable events documented.

## 2024-04-18 NOTE — Anesthesia Postprocedure Evaluation (Signed)
 Anesthesia Post Note  Patient: Paul Holland  Procedure(s) Performed: ARTHROSCOPY, KNEE, WITH MEDIAL MENISCECTOMY (Left: Knee)  Patient location during evaluation: PACU Anesthesia Type: General Level of consciousness: awake and alert Pain management: pain level controlled Vital Signs Assessment: post-procedure vital signs reviewed and stable Respiratory status: spontaneous breathing, nonlabored ventilation, respiratory function stable and patient connected to nasal cannula oxygen Cardiovascular status: blood pressure returned to baseline and stable Postop Assessment: no apparent nausea or vomiting Anesthetic complications: no   No notable events documented.   Last Vitals:  Vitals:   04/18/24 1415 04/18/24 1424  BP:  131/80  Pulse: 80 67  Resp: 17   Temp:  36.6 C  SpO2: 100% 97%    Last Pain:  Vitals:   04/18/24 1424  TempSrc: Oral  PainSc: 0-No pain                 Lattie Poli

## 2024-04-18 NOTE — H&P (Signed)
 Paper H&P to be scanned into permanent record. H&P reviewed. No significant changes noted.

## 2024-04-18 NOTE — Anesthesia Preprocedure Evaluation (Signed)
 Anesthesia Evaluation  Patient identified by MRN, date of birth, ID band Patient awake    Reviewed: Allergy & Precautions, NPO status , Patient's Chart, lab work & pertinent test results  History of Anesthesia Complications Negative for: history of anesthetic complications  Airway Mallampati: III  TM Distance: >3 FB Neck ROM: Full    Dental no notable dental hx. (+) Teeth Intact   Pulmonary neg pulmonary ROS, neg sleep apnea, neg COPD, Patient abstained from smoking.Not current smoker, former smoker   Pulmonary exam normal breath sounds clear to auscultation       Cardiovascular Exercise Tolerance: Good METS(-) hypertension(-) CAD and (-) Past MI negative cardio ROS (-) dysrhythmias  Rhythm:Regular Rate:Normal - Systolic murmurs    Neuro/Psych  PSYCHIATRIC DISORDERS Anxiety     negative neurological ROS     GI/Hepatic ,neg GERD  ,,(+)     (-) substance abuse    Endo/Other  neg diabetes    Renal/GU negative Renal ROS     Musculoskeletal   Abdominal  (+) + obese  Peds  Hematology   Anesthesia Other Findings Past Medical History: No date: Allergic rhinitis due to pollen No date: Anxiety No date: Asthma 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  Reproductive/Obstetrics                             Anesthesia Physical Anesthesia Plan  ASA: 2  Anesthesia Plan: General   Post-op Pain Management: Ofirmev  IV (intra-op)* and Toradol IV (intra-op)*   Induction: Intravenous  PONV Risk Score and Plan: 2 and Ondansetron , Dexamethasone  and Midazolam   Airway Management Planned: LMA  Additional Equipment: None  Intra-op Plan:   Post-operative Plan: Extubation in OR  Informed Consent: I have reviewed the  patients History and Physical, chart, labs and discussed the procedure including the risks, benefits and alternatives for the proposed anesthesia with the patient or authorized representative who has indicated his/her understanding and acceptance.     Dental advisory given  Plan Discussed with: CRNA and Surgeon  Anesthesia Plan Comments: (Discussed risks of anesthesia with patient, including PONV, sore throat, lip/dental/eye damage. Rare risks discussed as well, such as cardiorespiratory and neurological sequelae, and allergic reactions. Discussed the role of CRNA in patient's perioperative care. Patient understands.)       Anesthesia Quick Evaluation

## 2024-04-19 ENCOUNTER — Encounter: Payer: Self-pay | Admitting: Orthopedic Surgery

## 2024-04-29 ENCOUNTER — Encounter: Payer: Self-pay | Admitting: Internal Medicine

## 2024-05-27 ENCOUNTER — Ambulatory Visit: Admitting: Internal Medicine

## 2024-05-27 VITALS — BP 124/86 | HR 98 | Temp 98.8°F | Ht 70.5 in | Wt 246.0 lb

## 2024-05-27 DIAGNOSIS — J069 Acute upper respiratory infection, unspecified: Secondary | ICD-10-CM | POA: Diagnosis not present

## 2024-05-27 NOTE — Assessment & Plan Note (Signed)
 Clearly seems viral COVID test negative Discussed symptomatic care Okay to return to work If worsens at end of the week, would treat empirically with augmentin 875 bid x 7 days (for sinusitis)

## 2024-05-27 NOTE — Progress Notes (Signed)
 Subjective:    Patient ID: Paul Holland, male    DOB: 12/02/1971, 53 y.o.   MRN: [id number]  HPI Here due to respiratory illness  Had another procedure on left knee meniscus Walking better now  Respiratory illness started 4 days ago Next 2 days really bad Lots of head congestion, runny nose, chills, cough Woke freezing yesterday Some swollen glands in neck yesterday Some sore throat--likely from drainage No SOB Some left frontal/temporal headache No ear pain  Has rested--and tylenol  severe sinus (some help)  Current Outpatient Medications on File Prior to Visit  Medication Sig Dispense Refill   Ascorbic Acid (VITAMIN C) 250 MG CHEW Chew 500 mg by mouth daily.     Bacillus Coagulans-Inulin (ALIGN PREBIOTIC-PROBIOTIC) 5-1.25 MG-GM CHEW Chew 1 each by mouth daily.     cyclobenzaprine  (FLEXERIL ) 5 MG tablet TAKE 1 TABLET BY MOUTH AT BEDTIME AS NEEDED FOR MUSCLE SPASM 90 tablet 1   FIBER GUMMIES PO Take 2 each by mouth daily.     fluticasone -salmeterol (WIXELA INHUB) 250-50 MCG/ACT AEPB Inhale 1 puff into the lungs in the morning and at bedtime. (Patient taking differently: Inhale 1 puff into the lungs daily.) 60 each 11   gabapentin  (NEURONTIN ) 300 MG capsule TAKE 1 CAPSULE AT BEDTIME 90 capsule 3   GARCINIA CAMBOGIA-CHROMIUM PO Take 1 mL by mouth daily as needed (Appetite suppressant).     GARLIC PO Take 1 capsule by mouth daily.     loratadine (CLARITIN) 10 MG tablet Take 10 mg by mouth daily.     Multiple Vitamin (MULTI-VITAMINS) TABS Take 1 tablet by mouth daily.     Omega-3 Fatty Acids (FISH OIL) 1000 MG CAPS Take 1,000 mg by mouth daily.     ondansetron  (ZOFRAN -ODT) 4 MG disintegrating tablet Take 1 tablet (4 mg total) by mouth every 8 (eight) hours as needed for nausea or vomiting. 20 tablet 0   OVER THE COUNTER MEDICATION Take 0.5 mLs by mouth 3 (three) times daily. Complex Diet Drop     TURMERIC PO Take 1 capsule by mouth daily.     VITAMIN D PO Take 2 each by mouth  daily. Gummies     No current facility-administered medications on file prior to visit.    No Known Allergies  Past Medical History:  Diagnosis Date   Allergic rhinitis due to pollen    Anxiety    Asthma    Chemical-induced asthma (HCC)    resin exposure at work   Heart murmur    probably mild MR. Sees Dr Hester   Idiopathic peripheral neuropathy    Osteoarthritis of wrist     Past Surgical History:  Procedure Laterality Date   Cartilage removed from ribs  1981   COLONOSCOPY     KNEE ARTHROSCOPY WITH ANTERIOR CRUCIATE LIGAMENT (ACL) REPAIR Right 6/14 & 8/14   meniscus repair and tibial fracture repair (bone graft). ACL not repaired x 3   KNEE ARTHROSCOPY WITH MEDIAL MENISECTOMY Left 04/18/2024   Procedure: ARTHROSCOPY, KNEE, WITH MEDIAL MENISCECTOMY;  Surgeon: Tobie Priest, MD;  Location: [facility];  Service: Orthopedics;  Laterality: Left;  Left knee arthroscopic partial medial meniscectomy   KNEE ARTHROSCOPY WITH MENISCAL REPAIR Left 09/07/2023   Procedure: Left knee arthroscopic meniscus repair and patellar chondroplasty;  Surgeon: Tobie Priest, MD;  Location: [facility];  Service: Orthopedics;  Laterality: Left;    Family History  Problem Relation Age of Onset   Hypertension Mother    Diabetes Mother  Hypertension Father    Colon polyps Father    Diabetes Brother    Hyperlipidemia Brother    Hypertension Brother    Colon cancer Paternal Uncle    Lung cancer Paternal Grandfather    Heart disease Neg Hx    Esophageal cancer Neg Hx    Inflammatory bowel disease Neg Hx    Liver disease Neg Hx    Pancreatic cancer Neg Hx    Rectal cancer Neg Hx    Stomach cancer Neg Hx     Social History   Socioeconomic History   Marital status: Married    Spouse name: Not on file   Number of children: 3   Years of education: 14   Highest education level: Some college, no degree  Occupational History   Occupation: Merchandiser, retail-- factory    Comment: Teacher, music  Tobacco Use   Smoking status: Former    Current packs/day: 0.00    Average packs/day: 0.3 packs/day for 16.9 years (4.2 ttl pk-yrs)    Types: Cigarettes    Start date: 3    Quit date: 11/04/2007    Years since quitting: 16.5    Passive exposure: Never   Smokeless tobacco: Former  Building services engineer status: Never Used  Substance and Sexual Activity   Alcohol use: Yes    Comment: Occasionally - sporadic   Drug use: No   Sexual activity: Not on file  Other Topics Concern   Not on file  Social History Narrative   2nd marriage   3 children who live with their mother in [place] ----regular visits to them.   Lives in a 2 story home.     Education: some college.  Works as a Merchandiser, retail for a [employer].     Left Handed   Drinks Caffeine    Social Drivers of Health   Financial Resource Strain: Low Risk  (05/27/2024)   Overall Financial Resource Strain (CARDIA)    Difficulty of Paying Living Expenses: Not hard at all  Food Insecurity: No Food Insecurity (05/27/2024)   Hunger Vital Sign    Worried About Running Out of Food in the Last Year: Never true    Ran Out of Food in the Last Year: Never true  Transportation Needs: No Transportation Needs (05/27/2024)   PRAPARE - Administrator, Civil Service (Medical): No    Lack of Transportation (Non-Medical): No  Physical Activity: Insufficiently Active (05/27/2024)   Exercise Vital Sign    Days of Exercise per Week: 3 days    Minutes of Exercise per Session: 20 min  Stress: No Stress Concern Present (05/27/2024)   [employer] of Occupational Health - Occupational Stress Questionnaire    Feeling of Stress: Only a little  Social Connections: Moderately Isolated (05/27/2024)   Social Connection and Isolation Panel    Frequency of Communication with Friends and Family: Twice a week    Frequency of Social Gatherings with Friends and Family: Once a week    Attends Religious Services: Never    Loss adjuster, chartered or Organizations: No    Attends Engineer, structural: Not on file    Marital Status: Married  Catering manager Violence: Not on file   Review of Systems No change in smell or taste No N/V Eating okay---appetite is off     Objective:   Physical Exam Constitutional:      Appearance: Normal appearance.  HENT:     Head:  Comments: No sinus tenderness    Right Ear: Tympanic membrane and ear canal normal.     Left Ear: Tympanic membrane and ear canal normal.     Mouth/Throat:     Pharynx: No oropharyngeal exudate or posterior oropharyngeal erythema.  Pulmonary:     Effort: Pulmonary effort is normal.     Breath sounds: Normal breath sounds. No wheezing or rales.   Musculoskeletal:     Cervical back: Neck supple.  Lymphadenopathy:     Cervical: No cervical adenopathy.   Neurological:     Mental Status: He is alert.            Assessment & Plan:

## 2024-05-29 ENCOUNTER — Encounter: Payer: Self-pay | Admitting: Internal Medicine

## 2024-05-29 MED ORDER — AMOXICILLIN-POT CLAVULANATE 875-125 MG PO TABS: 1.0000 | ORAL_TABLET | Freq: Two times a day (BID) | ORAL | 0 refills | Status: AC

## 2024-06-05 MED ORDER — DOXYCYCLINE HYCLATE 100 MG PO TABS: 100.0000 mg | ORAL_TABLET | Freq: Two times a day (BID) | ORAL | 0 refills | Status: AC

## 2024-10-14 ENCOUNTER — Encounter

## 2024-12-15 ENCOUNTER — Encounter

## 2025-01-02 ENCOUNTER — Other Ambulatory Visit: Payer: Self-pay

## 2025-01-05 ENCOUNTER — Encounter

## 2025-02-24 ENCOUNTER — Encounter
